# Patient Record
Sex: Male | Born: 1945 | Hispanic: No | Smoking: Never smoker
Health system: Southern US, Community
[De-identification: ages and names within clinical notes are randomized; demographics above are authoritative.]

## PROBLEM LIST (undated history)

## (undated) DIAGNOSIS — N189 Chronic kidney disease, unspecified: Secondary | ICD-10-CM

## (undated) DIAGNOSIS — E875 Hyperkalemia: Secondary | ICD-10-CM

## (undated) DIAGNOSIS — M719 Bursopathy, unspecified: Secondary | ICD-10-CM

## (undated) DIAGNOSIS — I10 Essential (primary) hypertension: Secondary | ICD-10-CM

## (undated) DIAGNOSIS — E119 Type 2 diabetes mellitus without complications: Secondary | ICD-10-CM

## (undated) DIAGNOSIS — D649 Anemia, unspecified: Secondary | ICD-10-CM

## (undated) HISTORY — PX: HERNIA REPAIR: SHX51

## (undated) HISTORY — PX: EYE SURGERY: SHX253

## (undated) HISTORY — PX: KNEE ARTHROSCOPY: SHX127

---

## 2010-07-21 ENCOUNTER — Ambulatory Visit: Payer: Self-pay | Admitting: Unknown Physician Specialty

## 2012-09-11 ENCOUNTER — Ambulatory Visit: Payer: Self-pay | Admitting: Family Medicine

## 2013-07-08 ENCOUNTER — Ambulatory Visit: Payer: Self-pay | Admitting: Surgery

## 2013-07-08 LAB — BASIC METABOLIC PANEL
ANION GAP: 4 — AB (ref 7–16)
BUN: 25 mg/dL — AB (ref 7–18)
CHLORIDE: 108 mmol/L — AB (ref 98–107)
Calcium, Total: 8.9 mg/dL (ref 8.5–10.1)
Co2: 25 mmol/L (ref 21–32)
Creatinine: 1.61 mg/dL — ABNORMAL HIGH (ref 0.60–1.30)
EGFR (African American): 50 — ABNORMAL LOW
GFR CALC NON AF AMER: 43 — AB
Glucose: 149 mg/dL — ABNORMAL HIGH (ref 65–99)
OSMOLALITY: 281 (ref 275–301)
Potassium: 4.5 mmol/L (ref 3.5–5.1)
SODIUM: 137 mmol/L (ref 136–145)

## 2013-07-08 LAB — CBC
HCT: 35.9 % — ABNORMAL LOW (ref 40.0–52.0)
HGB: 12 g/dL — ABNORMAL LOW (ref 13.0–18.0)
MCH: 29.8 pg (ref 26.0–34.0)
MCHC: 33.5 g/dL (ref 32.0–36.0)
MCV: 89 fL (ref 80–100)
Platelet: 183 10*3/uL (ref 150–440)
RBC: 4.04 10*6/uL — ABNORMAL LOW (ref 4.40–5.90)
RDW: 13.6 % (ref 11.5–14.5)
WBC: 11 10*3/uL — AB (ref 3.8–10.6)

## 2013-07-15 ENCOUNTER — Ambulatory Visit: Payer: Self-pay | Admitting: Surgery

## 2014-05-30 NOTE — Op Note (Signed)
PATIENT NAME:  Thomas Rice, Thomas Rice MR#:  [id number] DATE OF BIRTH:  [birth year]  DATE OF PROCEDURE:  07/15/2013  PREOPERATIVE DIAGNOSIS: Recurrent left inguinal hernia.   POSTOPERATIVE DIAGNOSIS: Recurrent left inguinal hernia.   PROCEDURE: Left inguinal hernia repair.   SURGEON: Renda RollsWilton Smith, M.D.   ANESTHESIA: General.   INDICATIONS: This 69 year old male, who had laparoscopic left inguinal hernia repair some 20 years ago came in with a history of bulging dating back to January. He did have physical findings of a left inguinal hernia with approximately a 5 cm bulge in the left groin, which was reducible. Repair was recommended for definitive treatment.   DESCRIPTION OF PROCEDURE: The patient was placed on the operating table in the supine position under general anesthesia. The left lower quadrant of the abdomen was clipped and prepared with ChloraPrep and draped in a sterile manner.   A left lower quadrant transversely oriented suprapubic incision was made and carried down through subcutaneous tissues. Several small bleeding points were cauterized. Scarpa'Rice fascia was incised. The external oblique aponeurosis was incised along the course of its fibers to open the external ring and expose the inguinal cord structures. These cord structures were mobilized. A Penrose drain was passed around the cord structures. There was a finding of a direct inguinal hernia with approximately 2.5 cm area of weakness of the floor of the inguinal canal. Cremaster fibers were spread to expose the cord structures, which there was no indirect component.   Next, a circumferential incision was made in the attenuated transversalis fascia, which was resected. Next, the hernia sac was inverted. The repair was carried out with a row of 0 Surgilon sutures beginning at the pubic tubercle, suturing the conjoined tendon to the Cooper'Rice ligament with transition stitch onto the shelving edge of the inguinal ligament. The last stitch  led to satisfactory narrowing of the internal ring. Next, an onlay Bard soft mesh was cut to create an oval shape of some 2.8 x 4.5 cm with a notch cut out to straddle the cord structures. The mesh was sutured to the fascia and to the repair with interrupted 0 Surgilon sutures. Hemostasis was intact. Cord structures were replaced along the floor of the inguinal canal. The cut edges of the external oblique aponeurosis were approximated with a running 4-0 Vicryl suture to recreate the external ring. The deep fascia superior and lateral to the repair site was infiltrated with 0.5% Sensorcaine with epinephrine. The subcutaneous tissues were infiltrated as well using a total of 20 mL. Next, the Scarpa'Rice fascia was closed with interrupted 4-0 Vicryl. The skin was closed with running 4-0 Monocryl subcuticular suture and Dermabond. The testicle remained in the scrotum. The patient tolerated surgery satisfactorily and was then prepared for transfer to the recovery room.   ____________________________ Shela CommonsJ. Renda RollsWilton Smith, MD jws:aw D: 07/15/2013 08:53:47 ET T: 07/15/2013 09:10:24 ET JOB#: [id number]  cc: Adella HareJ. Wilton Smith, MD, <Dictator> Adella HareWILTON J SMITH MD ELECTRONICALLY SIGNED 07/15/2013 17:26

## 2014-10-13 IMAGING — US US EXTREM LOW VENOUS*R*
1 series · 14 of 23 positions shown · non-contrast
Comparison: none

REASON FOR EXAM: edema  fell 9 days ago  eval for DVT
COMMENTS:

PROCEDURE:     US  - US DOPPLER LOW EXTR RIGHT  - September 11, 2012  [DATE]
RESULT:     Comparison: None

[Series 1: us extrem low venous*right* · 0.10mm/px · 14 of 23 slices shown]
[im 1/23]
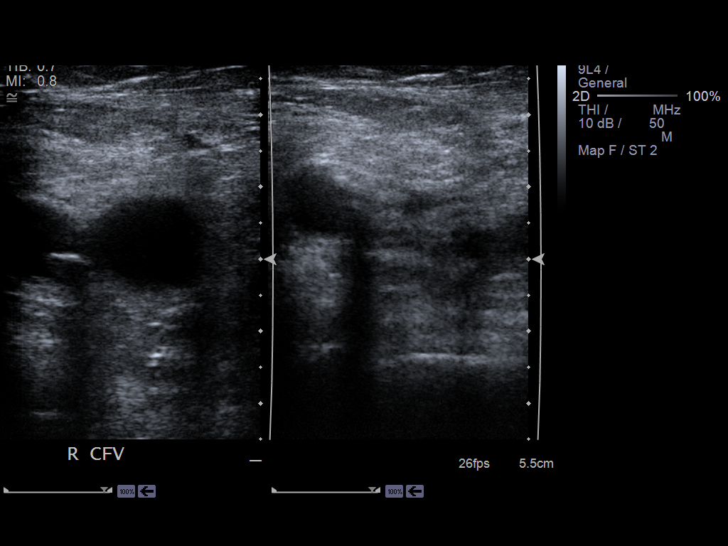
[im 3/23]
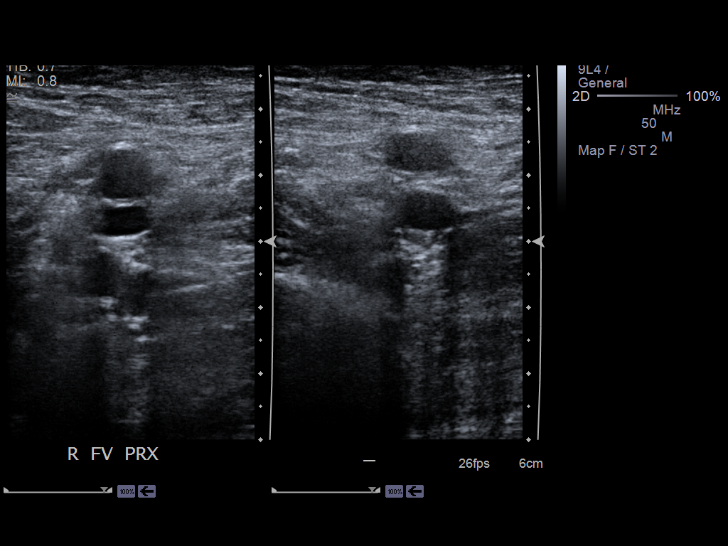
[im 5/23]
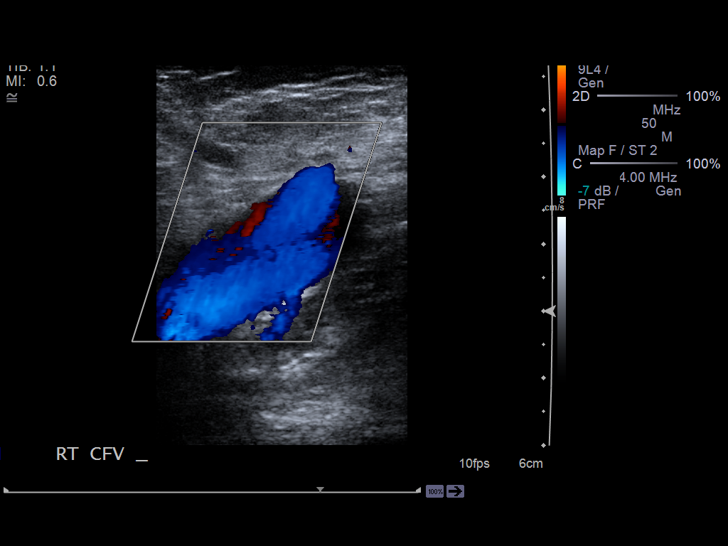
[im 6/23]
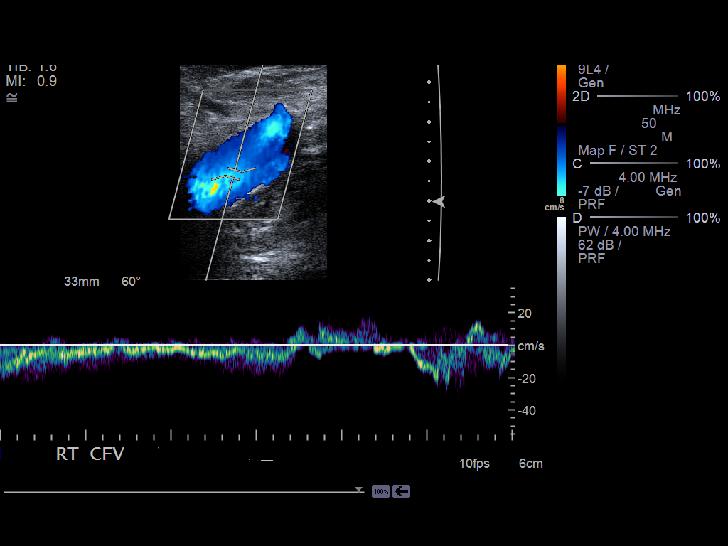
[im 8/23]
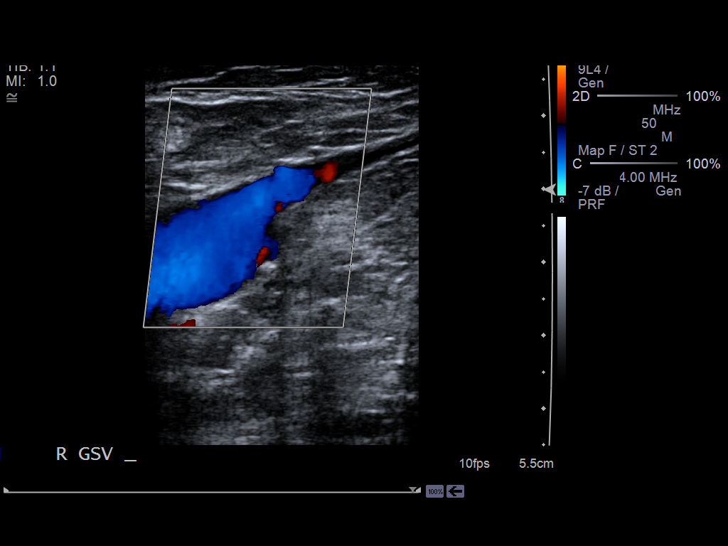
[im 10/23]
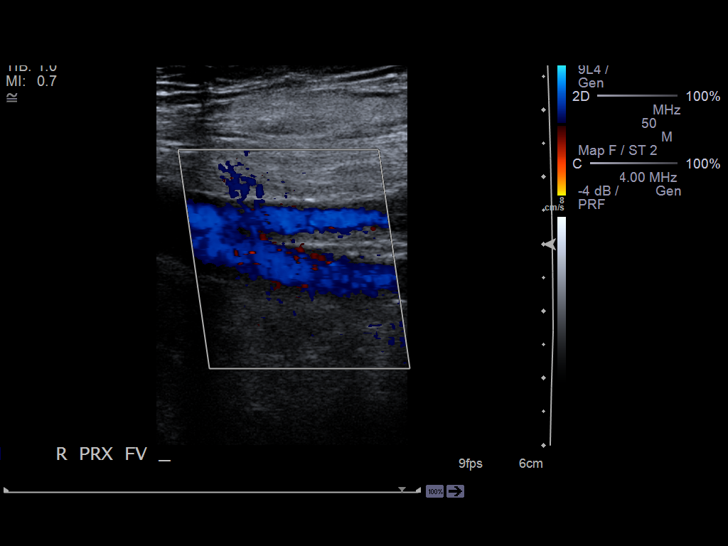
[im 11/23]
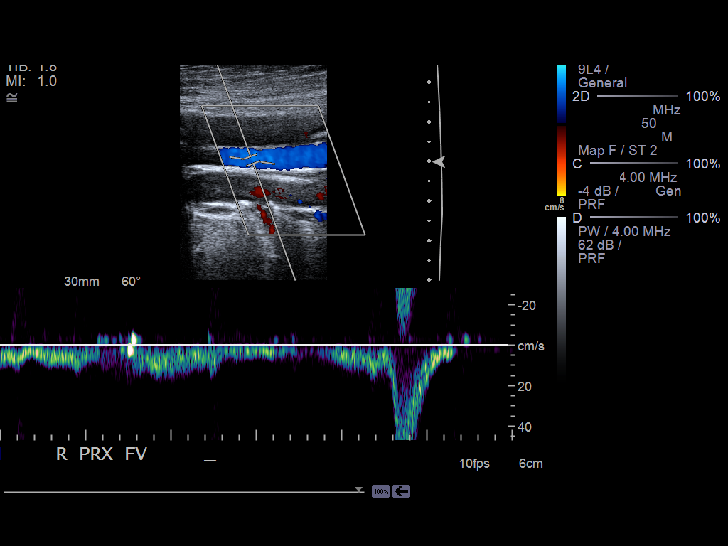
[im 13/23]
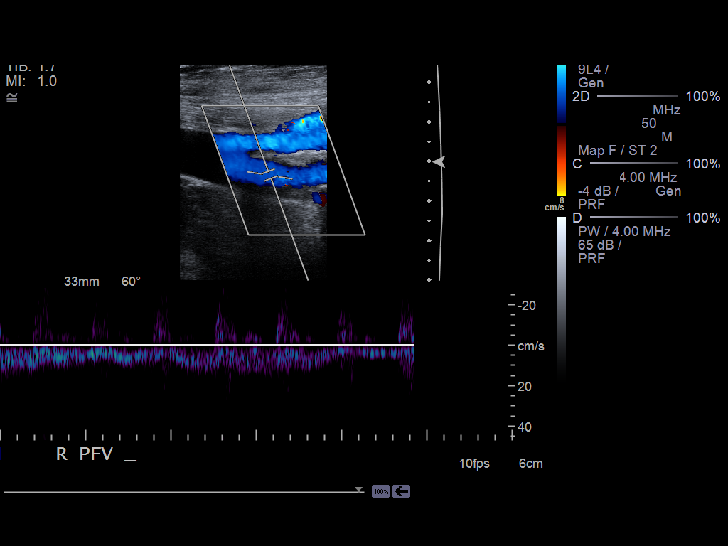
[im 14/23]
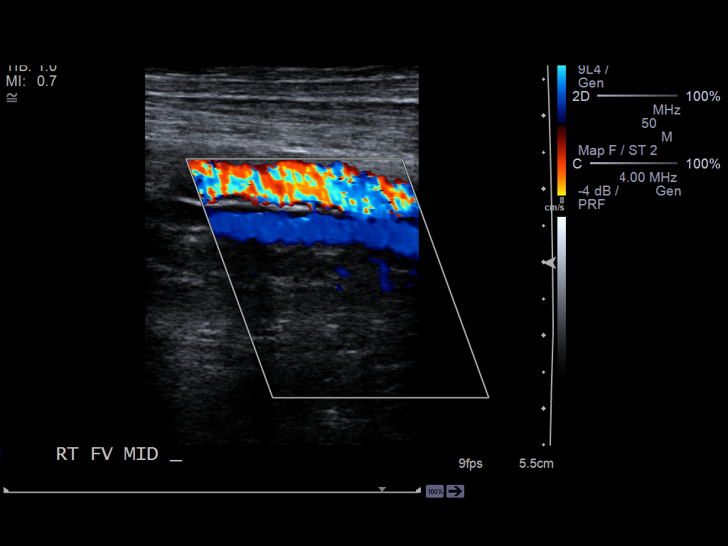
[im 16/23]
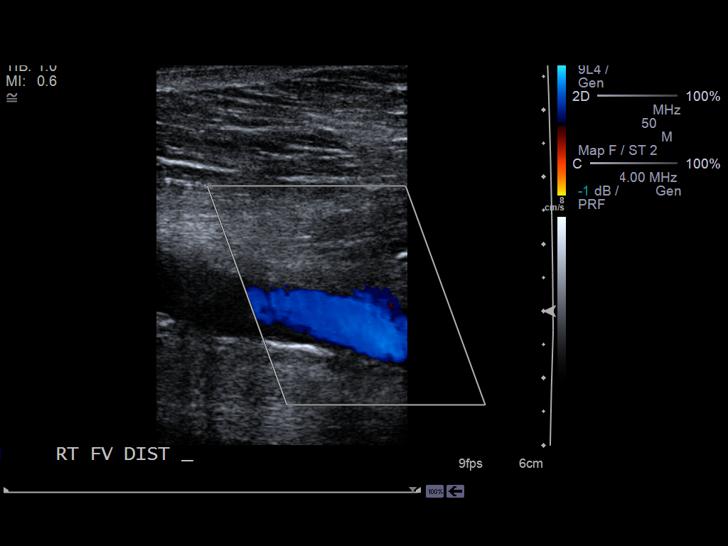
[im 18/23]
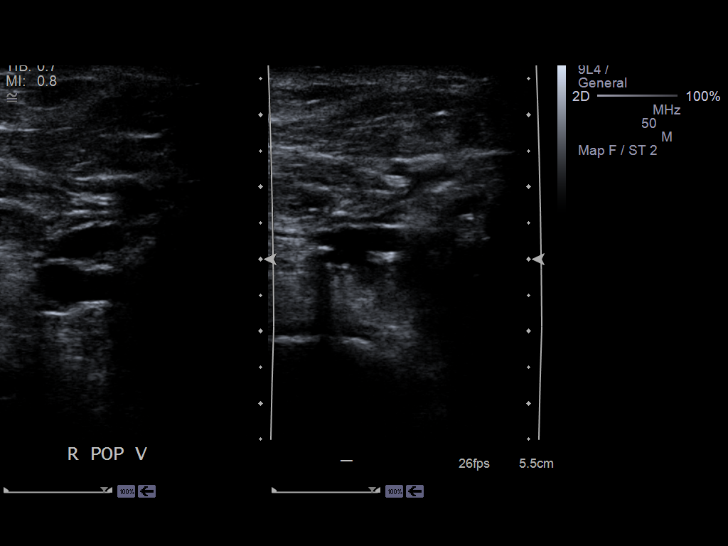
[im 19/23]
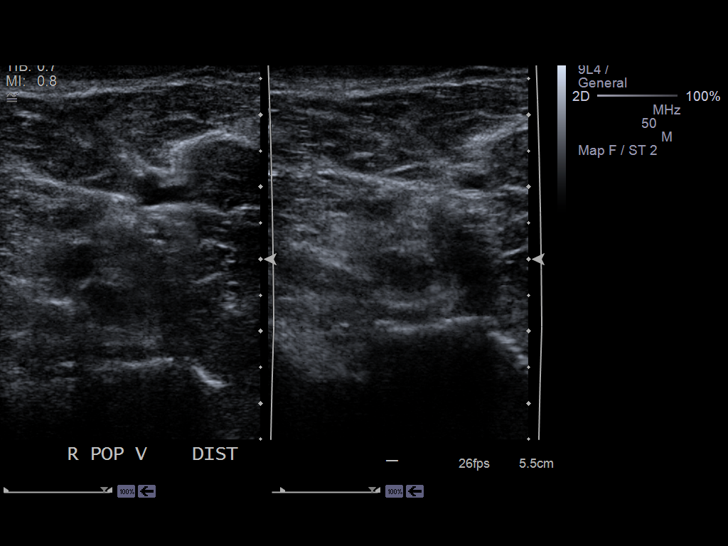
[im 21/23]
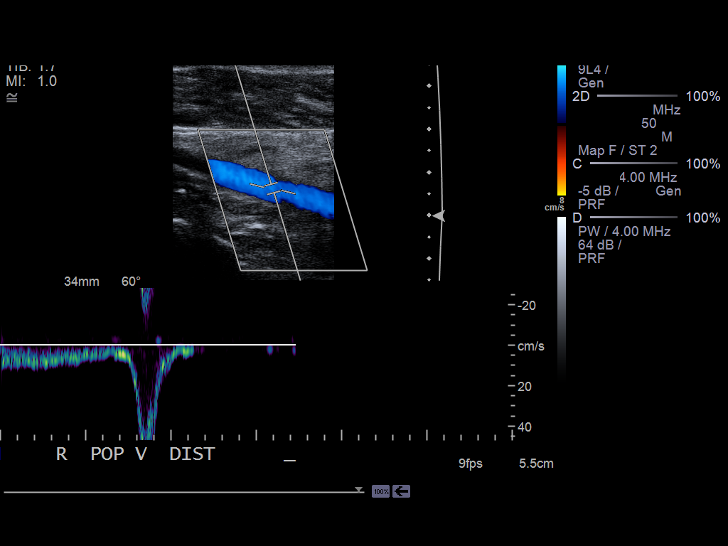
[im 23/23]
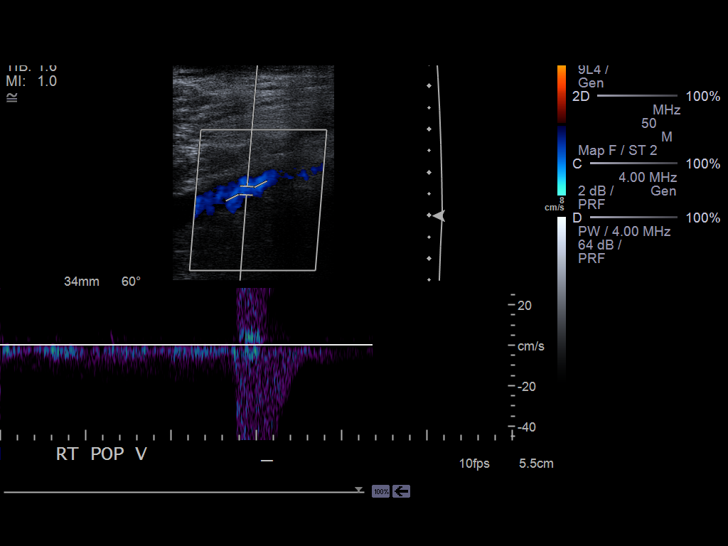

[14 of 23 positions shown; findings below may reference images not displayed]

FINDINGS: Multiple longitudinal and transverse gray-scale as well as color
and spectral Doppler images of the right lower extremity veins were obtained
from the common femoral veins through the popliteal veins.

The right common femoral, greater saphenous, femoral, popliteal veins, and
venous trifurcation are patent, demonstrating normal color-flow and
compressibility. No intraluminal thrombus is identified.There is normal
respiratory variation and augmentation demonstrated at all vein levels.
IMPRESSION: No evidence of DVT in the right lower extremity.

[REDACTED]

## 2018-12-06 ENCOUNTER — Other Ambulatory Visit: Payer: Self-pay | Admitting: Gastroenterology

## 2018-12-06 DIAGNOSIS — R748 Abnormal levels of other serum enzymes: Secondary | ICD-10-CM

## 2018-12-12 ENCOUNTER — Other Ambulatory Visit: Payer: Self-pay

## 2018-12-12 ENCOUNTER — Ambulatory Visit
Admission: RE | Admit: 2018-12-12 | Discharge: 2018-12-12 | Disposition: A | Discharge status: Home / Self Care | Payer: Medicare HMO | Source: Ambulatory Visit | Attending: Gastroenterology | Admitting: Gastroenterology

## 2018-12-12 DIAGNOSIS — R748 Abnormal levels of other serum enzymes: Secondary | ICD-10-CM | POA: Diagnosis present

## 2019-06-27 ENCOUNTER — Ambulatory Visit: Payer: Self-pay | Admitting: General Surgery

## 2019-06-27 NOTE — H&P (Signed)
PATIENT PROFILE: Thomas Rice is a 74 y.o. male who presents to the Clinic for consultation at the request of Dr. Lennox Grumbles for evaluation of left inguinal hernia.  PCP:  Salley Scarlet, MD  HISTORY OF PRESENT ILLNESS: Thomas Rice reports he had a left inguinal hernia repair on 2016.  He reports that he was doing well until few months ago when he started feeling a lump in the left groin.  Reports some discomfort.  No pain radiation.  No alleviating or aggravating factor.  He usually laid down to reducible large and is not painful.  Denies abdominal distention nausea or vomiting.  He reported he had laparoscopic right inguinal hernia repair in the 1990s and then open repair of the left groin on 2016.   PROBLEM LIST:        Problem List  Date Reviewed: 07/02/2013       Noted   Diabetes mellitus type 2, uncomplicated (CMS-HCC) Unknown   Hypertension Unknown      GENERAL REVIEW OF SYSTEMS:   General ROS: negative for - chills, fatigue, fever, weight gain or weight loss Allergy and Immunology ROS: negative for - hives  Hematological and Lymphatic ROS: negative for - bleeding problems or bruising, negative for palpable nodes Endocrine ROS: negative for - heat or cold intolerance, hair changes Respiratory ROS: negative for - cough, shortness of breath or wheezing Cardiovascular ROS: no chest pain or palpitations GI ROS: negative for nausea, vomiting, abdominal pain, diarrhea, constipation Musculoskeletal ROS: negative for - joint swelling or muscle pain Neurological ROS: negative for - confusion, syncope Dermatological ROS: negative for pruritus and rash Psychiatric: negative for anxiety, depression, difficulty sleeping and memory loss  MEDICATIONS: Current Medications        Current Outpatient Medications  Medication Sig Dispense Refill  . ACCU-CHEK AVIVA PLUS TEST STRP test strip     . ACCU-CHEK SOFTCLIX LANCETS lancets     . aspirin 81 MG EC tablet     .  enalapril (VASOTEC) 20 MG tablet Take 20 mg by mouth once daily.    . ergocalciferol, vitamin D2, (VITAMIN D2 ORAL) Take 1,250 mcg by mouth monthly       . glipiZIDE (GLUCOTROL XL) 5 MG XL tablet Take 10 mg by mouth once daily.     Marland Kitchen linaGLIPtin (TRADJENTA) 5 mg tablet Take 1 tablet by mouth once daily     No current facility-administered medications for this visit.      ALLERGIES: Patient has no known allergies.  PAST MEDICAL HISTORY:     Past Medical History:  Diagnosis Date  . Diabetes mellitus type 2, uncomplicated (CMS-HCC)   . Hypertension     PAST SURGICAL HISTORY:      Past Surgical History:  Procedure Laterality Date  . INGUINAL HERNIA REPAIR Right 1990s  . KNEE ARTHROSCOPY  1980  . REPAIR INGUINAL HERNIA Left 2015   Dr. Tamala Julian     FAMILY HISTORY:      Family History  Problem Relation Age of Onset  . High blood pressure (Hypertension) Father   . Diabetes Maternal Grandfather   . High blood pressure (Hypertension) Maternal Grandfather      SOCIAL HISTORY: Social History          Socioeconomic History  . Marital status: Married    Spouse name: Not on file  . Number of children: Not on file  . Years of education: Not on file  . Highest education level: Not on file  Occupational History  .  Not on file  Tobacco Use  . Smoking status: Never Smoker  . Smokeless tobacco: Never Used  Substance and Sexual Activity  . Alcohol use: No  . Drug use: No  . Sexual activity: Not on file  Other Topics Concern  . Not on file  Social History Narrative  . Not on file   Social Determinants of Health      Financial Resource Strain:   . Difficulty of Paying Living Expenses:   Food Insecurity:   . Worried About Charity fundraiser in the Last Year:   . Arboriculturist in the Last Year:   Transportation Needs:   . Film/video editor (Medical):   Marland Kitchen Lack of Transportation (Non-Medical):       PHYSICAL EXAM:    Vitals:    06/12/19 1104  BP: 176/85  Pulse: 80   Body mass index is 25.7 kg/m. Weight: 76.7 kg (169 lb)   GENERAL: Alert, active, oriented x3  HEENT: Pupils equal reactive to light. Extraocular movements are intact. Sclera clear. Palpebral conjunctiva normal red color.Pharynx clear.  NECK: Supple with no palpable mass and no adenopathy.  LUNGS: Sound clear with no rales rhonchi or wheezes.  HEART: Regular rhythm S1 and S2 without murmur.  ABDOMEN: Soft and depressible, nontender with no palpable mass, no hepatomegaly.  Soft reducible left inguinal hernia.  EXTREMITIES: Well-developed well-nourished symmetrical with no dependent edema.  NEUROLOGICAL: Awake alert oriented, facial expression symmetrical, moving all extremities.  REVIEW OF DATA: I have reviewed the following data today: No visits with results within 3 Month(s) from this visit.  Latest known visit with results is:  Initial consult on 12/06/2018  Component Date Value  . WBC (White Blood Cell Co* 12/06/2018 7.2   . RBC (Red Blood Cell Coun* 12/06/2018 4.23*  . Hemoglobin 12/06/2018 12.7*  . Hematocrit 12/06/2018 39.4*  . MCV (Mean Corpuscular Vo* 12/06/2018 93.1   . MCH (Mean Corpuscular He* 12/06/2018 30.0   . MCHC (Mean Corpuscular H* 12/06/2018 32.2   . Platelet Count 12/06/2018 221   . RDW-CV (Red Cell Distrib* 12/06/2018 12.8   . MPV (Mean Platelet Volum* 12/06/2018 10.8   . Neutrophils 12/06/2018 5.23   . Lymphocytes 12/06/2018 1.18   . Monocytes 12/06/2018 0.49   . Eosinophils 12/06/2018 0.18   . Basophils 12/06/2018 0.07   . Neutrophil % 12/06/2018 72.9*  . Lymphocyte % 12/06/2018 16.5   . Monocyte % 12/06/2018 6.8   . Eosinophil % 12/06/2018 2.5   . Basophil% 12/06/2018 1.0   . Immature Granulocyte % 12/06/2018 0.3   . Immature Granulocyte Cou* 12/06/2018 0.02   . Ferritin 12/06/2018 131   . Iron 12/06/2018 124   . Total Iron Binding Capac* 12/06/2018 380.1   . Transferrin 12/06/2018 271.5    . % Saturation 12/06/2018 33   . Vitamin D, 25-Hydroxy - * 12/06/2018 31.3   . Vitamin B12 12/06/2018 199*  . Folate (Folic Acid) 02/54/2706 17.6   . Protein, Total 12/06/2018 7.3   . Albumin 12/06/2018 4.7   . Bilirubin, Total 12/06/2018 0.5   . Bilirubin, Conjugated 12/06/2018 0.10   . Alk Phos (alkaline Phosp* 12/06/2018 134*  . AST  12/06/2018 16   . ALT  12/06/2018 14   . Antinuclear Antibodies, * 12/06/2018 Negative   . Smooth Muscle Ab - [facility]* 12/06/2018 8   . Mitochondrial Ab - [facility]* 12/06/2018 <20.0   . Total IgG - [facility] 12/06/2018 1125   . Immunoglobulin A,  Qn, Se* 12/06/2018 287   . IgM - [facility] 12/06/2018 53   . Immunoglobulin E, Total * 12/06/2018 70   . Ceruloplasmin - [facility] 12/06/2018 15.7*  . A-1 Antitrypsin - [facility] 12/06/2018 134   . A-1 Antitrypsin Phenotyp* 12/06/2018 MM   . Prothrombin Time 12/06/2018 11.6   . Prothrombin INR 12/06/2018 1.0*  . Hepatitis B Surface Anti* 12/06/2018 Negative   . Hepatitis Be Antigen - L* 12/06/2018 Negative   . Hepatitis B Core IgM Ant* 12/06/2018 Negative   . Hep B Core Total Ab - La* 12/06/2018 Negative   . Hep B E Ab - [facility] 12/06/2018 Negative   . Hep B Surface Ab, Qual -* 12/06/2018 Non Reactive   . Hep A IgM - [facility] 12/06/2018 Negative   . Gamma Glutamyl Transfera* 12/06/2018 15   . Alkaline Phosphatase - L* 12/06/2018 149*  . Liver Fraction: - [facility] 12/06/2018 64   . Bone Fraction: - [facility] 12/06/2018 36   . Intestinal Frac.: - [facility]* 12/06/2018 0      ASSESSMENT: Thomas Rice is a 74 y.o. male presenting for consultation for left recurrent inguinal hernia.    The patient presents with a symptomatic, reducible recurrent left inguinal hernia. Patient was oriented about the diagnosis of inguinal hernia and its implication. The patient was oriented about the treatment alternatives (observation vs surgical repair). Due to patient symptoms, repair is recommended. Patient oriented about the surgical  procedure, the use of mesh and its risk of complications such as: infection, bleeding, injury to vas deference, vasculature and testicle, injury to bowel or bladder, and chronic pain.  Unilateral recurrent inguinal hernia without obstruction or gangrene [K40.91]  PLAN: 1. Robotic assisted laparoscopic left recurrent inguinal hernia repair with mesh ([id number]) 2.  CBC, CMP 3.  Avoid taking aspirin 5 days before procedure 4.  Contact us if has any question or concern.  Patient verbalized understanding, all questions were answered, and were agreeable with the plan outlined above.    Sharee Sturdy Cintron-Diaz, MD  

## 2019-06-27 NOTE — H&P (View-Only) (Signed)
PATIENT PROFILE: Thomas Rice is a 74 y.o. male who presents to the Clinic for consultation at the request of Dr. Lennox Rice for evaluation of left inguinal hernia.  PCP:  Thomas Scarlet, MD  HISTORY OF PRESENT ILLNESS: Thomas Rice reports he had a left inguinal hernia repair on 2016.  He reports that he was doing well until few months ago when he started feeling a lump in the left groin.  Reports some discomfort.  No pain radiation.  No alleviating or aggravating factor.  He usually laid down to reducible large and is not painful.  Denies abdominal distention nausea or vomiting.  He reported he had laparoscopic right inguinal hernia repair in the 1990s and then open repair of the left groin on 2016.   PROBLEM LIST:        Problem List  Date Reviewed: 07/02/2013       Noted   Diabetes mellitus type 2, uncomplicated (CMS-HCC) Unknown   Hypertension Unknown      GENERAL REVIEW OF SYSTEMS:   General ROS: negative for - chills, fatigue, fever, weight gain or weight loss Allergy and Immunology ROS: negative for - hives  Hematological and Lymphatic ROS: negative for - bleeding problems or bruising, negative for palpable nodes Endocrine ROS: negative for - heat or cold intolerance, hair changes Respiratory ROS: negative for - cough, shortness of breath or wheezing Cardiovascular ROS: no chest pain or palpitations GI ROS: negative for nausea, vomiting, abdominal pain, diarrhea, constipation Musculoskeletal ROS: negative for - joint swelling or muscle pain Neurological ROS: negative for - confusion, syncope Dermatological ROS: negative for pruritus and rash Psychiatric: negative for anxiety, depression, difficulty sleeping and memory loss  MEDICATIONS: Current Medications        Current Outpatient Medications  Medication Sig Dispense Refill  . ACCU-CHEK AVIVA PLUS TEST STRP test strip     . ACCU-CHEK SOFTCLIX LANCETS lancets     . aspirin 81 MG EC tablet     .  enalapril (VASOTEC) 20 MG tablet Take 20 mg by mouth once daily.    . ergocalciferol, vitamin D2, (VITAMIN D2 ORAL) Take 1,250 mcg by mouth monthly       . glipiZIDE (GLUCOTROL XL) 5 MG XL tablet Take 10 mg by mouth once daily.     Thomas Rice Kitchen linaGLIPtin (TRADJENTA) 5 mg tablet Take 1 tablet by mouth once daily     No current facility-administered medications for this visit.      ALLERGIES: Patient has no known allergies.  PAST Thomas Rice HISTORY:     Past Thomas Rice History:  Diagnosis Date  . Diabetes mellitus type 2, uncomplicated (CMS-HCC)   . Hypertension     PAST SURGICAL HISTORY:      Past Surgical History:  Procedure Laterality Date  . INGUINAL HERNIA REPAIR Right 1990s  . KNEE ARTHROSCOPY  1980  . REPAIR INGUINAL HERNIA Left 2015   Thomas Rice     FAMILY HISTORY:      Family History  Problem Relation Age of Onset  . High blood pressure (Hypertension) Father   . Diabetes Maternal Grandfather   . High blood pressure (Hypertension) Maternal Grandfather      SOCIAL HISTORY: Social History          Socioeconomic History  . Marital status: Married    Spouse name: Not on file  . Number of children: Not on file  . Years of education: Not on file  . Highest education level: Not on file  Occupational History  .  Not on file  Tobacco Use  . Smoking status: Never Smoker  . Smokeless tobacco: Never Used  Substance and Sexual Activity  . Alcohol use: No  . Drug use: No  . Sexual activity: Not on file  Other Topics Concern  . Not on file  Social History Narrative  . Not on file   Social Determinants of Health      Financial Resource Strain:   . Difficulty of Paying Living Expenses:   Food Insecurity:   . Worried About Charity fundraiser in the Last Year:   . Arboriculturist in the Last Year:   Transportation Needs:   . Film/video editor (Thomas Rice):   Thomas Rice Kitchen Lack of Transportation (Non-Thomas Rice):       PHYSICAL EXAM:    Vitals:    06/12/19 1104  BP: 176/85  Pulse: 80   Body mass index is 25.7 kg/m. Weight: 76.7 kg (169 lb)   GENERAL: Alert, active, oriented x3  HEENT: Pupils equal reactive to light. Extraocular movements are intact. Sclera clear. Palpebral conjunctiva normal red color.Pharynx clear.  NECK: Supple with no palpable mass and no adenopathy.  LUNGS: Sound clear with no rales rhonchi or wheezes.  HEART: Regular rhythm S1 and S2 without murmur.  ABDOMEN: Soft and depressible, nontender with no palpable mass, no hepatomegaly.  Soft reducible left inguinal hernia.  EXTREMITIES: Well-developed well-nourished symmetrical with no dependent edema.  NEUROLOGICAL: Awake alert oriented, facial expression symmetrical, moving all extremities.  REVIEW OF DATA: I have reviewed the following data today: No visits with results within 3 Month(s) from this visit.  Latest known visit with results is:  Initial consult on 12/06/2018  Component Date Value  . WBC (White Blood Cell Co* 12/06/2018 7.2   . RBC (Red Blood Cell Coun* 12/06/2018 4.23*  . Hemoglobin 12/06/2018 12.7*  . Hematocrit 12/06/2018 39.4*  . MCV (Mean Corpuscular Vo* 12/06/2018 93.1   . MCH (Mean Corpuscular He* 12/06/2018 30.0   . MCHC (Mean Corpuscular H* 12/06/2018 32.2   . Platelet Count 12/06/2018 221   . RDW-CV (Red Cell Distrib* 12/06/2018 12.8   . MPV (Mean Platelet Volum* 12/06/2018 10.8   . Neutrophils 12/06/2018 5.23   . Lymphocytes 12/06/2018 1.18   . Monocytes 12/06/2018 0.49   . Eosinophils 12/06/2018 0.18   . Basophils 12/06/2018 0.07   . Neutrophil % 12/06/2018 72.9*  . Lymphocyte % 12/06/2018 16.5   . Monocyte % 12/06/2018 6.8   . Eosinophil % 12/06/2018 2.5   . Basophil% 12/06/2018 1.0   . Immature Granulocyte % 12/06/2018 0.3   . Immature Granulocyte Cou* 12/06/2018 0.02   . Ferritin 12/06/2018 131   . Iron 12/06/2018 124   . Total Iron Binding Capac* 12/06/2018 380.1   . Transferrin 12/06/2018 271.5    . % Saturation 12/06/2018 33   . Vitamin D, 25-Hydroxy - * 12/06/2018 31.3   . Vitamin B12 12/06/2018 199*  . Folate (Folic Acid) 02/54/2706 17.6   . Protein, Total 12/06/2018 7.3   . Albumin 12/06/2018 4.7   . Bilirubin, Total 12/06/2018 0.5   . Bilirubin, Conjugated 12/06/2018 0.10   . Alk Phos (alkaline Phosp* 12/06/2018 134*  . AST  12/06/2018 16   . ALT  12/06/2018 14   . Antinuclear Antibodies, * 12/06/2018 Negative   . Smooth Muscle Ab - [facility]* 12/06/2018 8   . Mitochondrial Ab - [facility]* 12/06/2018 <20.0   . Total IgG - [facility] 12/06/2018 1125   . Immunoglobulin A,  Qn, Se* 12/06/2018 287   . IgM - [facility] 12/06/2018 53   . Immunoglobulin E, Total * 12/06/2018 70   . Ceruloplasmin - [facility] 12/06/2018 15.7*  . A-1 Antitrypsin - [facility] 12/06/2018 134   . A-1 Antitrypsin Phenotyp* 12/06/2018 MM   . Prothrombin Time 12/06/2018 11.6   . Prothrombin INR 12/06/2018 1.0*  . Hepatitis B Surface Anti* 12/06/2018 Negative   . Hepatitis Be Antigen - L* 12/06/2018 Negative   . Hepatitis B Core IgM Ant* 12/06/2018 Negative   . Hep B Core Total Ab - [facility]* 12/06/2018 Negative   . Hep B E Ab - [facility] 12/06/2018 Negative   . Hep B Surface Ab, Qual -* 12/06/2018 Non Reactive   . Hep A IgM - [facility] 12/06/2018 Negative   . Gamma Glutamyl Transfera* 12/06/2018 15   . Alkaline Phosphatase - L* 12/06/2018 149*  . Liver Fraction: - [facility] 12/06/2018 64   . Bone Fraction: - [facility] 12/06/2018 36   . Intestinal Frac.: - [facility]* 12/06/2018 0      ASSESSMENT: Mr. Duffey is a 74 y.o. male presenting for consultation for left recurrent inguinal hernia.    The patient presents with a symptomatic, reducible recurrent left inguinal hernia. Patient was oriented about the diagnosis of inguinal hernia and its implication. The patient was oriented about the treatment alternatives (observation vs surgical repair). Due to patient symptoms, repair is recommended. Patient oriented about the surgical  procedure, the use of mesh and its risk of complications such as: infection, bleeding, injury to vas deference, vasculature and testicle, injury to bowel or bladder, and chronic pain.  Unilateral recurrent inguinal hernia without obstruction or gangrene [K40.91]  PLAN: 1. Robotic assisted laparoscopic left recurrent inguinal hernia repair with mesh ([id number]) 2.  CBC, CMP 3.  Avoid taking aspirin 5 days before procedure 4.  Contact us if has any question or concern.  Patient verbalized understanding, all questions were answered, and were agreeable with the plan outlined above.    Herbert Pun, MD

## 2019-07-11 ENCOUNTER — Encounter
Admission: RE | Admit: 2019-07-11 | Discharge: 2019-07-11 | Disposition: A | Discharge status: Home / Self Care | Payer: Medicare HMO | Source: Ambulatory Visit | Attending: General Surgery | Admitting: General Surgery

## 2019-07-11 ENCOUNTER — Other Ambulatory Visit: Payer: Self-pay

## 2019-07-11 DIAGNOSIS — Z20822 Contact with and (suspected) exposure to covid-19: Secondary | ICD-10-CM | POA: Diagnosis not present

## 2019-07-11 DIAGNOSIS — E118 Type 2 diabetes mellitus with unspecified complications: Secondary | ICD-10-CM | POA: Insufficient documentation

## 2019-07-11 DIAGNOSIS — Z01818 Encounter for other preprocedural examination: Secondary | ICD-10-CM | POA: Insufficient documentation

## 2019-07-11 DIAGNOSIS — I10 Essential (primary) hypertension: Secondary | ICD-10-CM | POA: Insufficient documentation

## 2019-07-11 HISTORY — DX: Essential (primary) hypertension: I10

## 2019-07-11 HISTORY — DX: Type 2 diabetes mellitus without complications: E11.9

## 2019-07-11 HISTORY — DX: Chronic kidney disease, unspecified: N18.9

## 2019-07-11 HISTORY — DX: Hyperkalemia: E87.5

## 2019-07-11 HISTORY — DX: Bursopathy, unspecified: M71.9

## 2019-07-11 HISTORY — DX: Anemia, unspecified: D64.9

## 2019-07-11 NOTE — Pre-Procedure Instructions (Signed)
Progress Notes - documented in this encounter Thomas Dakin, MD - 06/10/2019 9:00 AM EDT Formatting of this note is different from the original. Images from the original note were not included. DIVISION OF NEPHROLOGY AND HYPERTENSION [employer] [address] [place], [place] [address]  Date of Service: 06/10/2019   PCP: Referring Provider:  Marguerita Merles, MD [address] [place] [address] Phone: [phone] Fax: [place], [place] Thomas Rice [address] [place], [place] [address] Phone: [phone] Fax: [phone]   06/10/2019  Background: Thomas Rice has been following with [place] since 2017 when he was evaluated for elevated Cr. He has a longstanding h/o HTN (since ~1980) and DM2 since his 50-60s. Cr had increased from previous BL of ~1.5 to 2 at time of referral. He did not have any findings on urine sediment and had only 28 mg/gm on UACR. Cause of CKD is believed to be 2/2 DM and HTN. DM2 has been well-controlled, and BP is usually pretty well controlled.  Chief Complaint: fu CKD, HTN and DM2  HPI: Thomas. Thomas Rice presents for routine 6-mo fu. I last saw him 01/06/2019. Today, he reports that he never started SGLT2i. He actually found that he was making this "loaf cake" that was leading to elevated BS. So he has decided to cut this out cold [place], and thinks his BS are improving. He notes that when he started eating like this, it was in part due to sadness and depression after his wife's death. But he feels that he is now listening to the voice in his head, which says to keep going.  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 He gets a lot of exercise between these two jobs. Staying busy helps him to avoid getting too sad.  He notes that his hernia hs  returned, and he has been referred to see GI at [facility] for it. It bulges and bothers him sometimes.  ROS:  CONSTITUTIONAL: denies fevers or chills, denies unintentional weight loss CARDIOVASCULAR: denies chest pain, denies dyspnea on exertion, denies leg edema GASTROINTESTINAL: denies nausea, denies vomiting, denies anorexia GENITOURINARY: denies dysuria, denies hematuria, denies foamy urine, denies decreased urinary stream All other systems reviewed and are negative except as listed above.  PAST MEDICAL HISTORY: Past Medical History:  Diagnosis Date  . Chronic kidney disease  . Hypertension   PAST SURGICAL HISTORY: Past Surgical History:  Procedure Laterality Date  . CATARACT EXTRACTION, BILATERAL Bilateral 2017  . INGUINAL HERNIA REPAIR  20 yrs apart  . KNEE SURGERY 1981  arthroscopic after injury   SOCIAL HISTORY: Social History   Socioeconomic History  . Marital status: Not on file  Spouse name: Not on file  . Number of children: Not on file  . Years of education: Not on file  . Highest education level: Not on file  Occupational History  . Not on file  Tobacco Use  . Smoking status: Never Smoker  . Smokeless tobacco: Never Used  Substance and Sexual Activity  . Alcohol use: No  Alcohol/week: 0.0 standard drinks  . Drug use: No  . Sexual activity: Not on file  Other Topics Concern  . Not on file  Social History Narrative  . Not on file   Social Determinants of Health   Financial  Resource Strain:  . Difficulty of Paying Living Expenses:  Food Insecurity:  . Worried About Charity fundraiser in the Last Year:  . Arboriculturist in the Last Year:  Transportation Needs:  . Film/video editor (Medical):  Marland Kitchen Lack of Transportation (Non-Medical):  Physical Activity:  . Days of Exercise per Week:  . Minutes of Exercise per Session:  Stress:  . Feeling of Stress :  Social Connections:  . Frequency of Communication with Friends and Family:  .  Frequency of Social Gatherings with Friends and Family:  . Attends Religious Services:  . Active Member of Clubs or Organizations:  . Attends Archivist Meetings:  Marland Kitchen Marital Status:   FAMILY HISTORY: Unchanged from previous visit. Family History  Problem Relation Age of Onset  . Alcohol abuse Mother  . Uterine cancer Mother  . Heart failure Mother  . Pneumonia Father  . Hypertension Father   ALLERGIES: Patient has no known allergies.  MEDICATIONS: Current Outpatient Medications  Medication Sig Dispense Refill  . aspirin (ECOTRIN) 81 MG tablet Take 81 mg by mouth daily.  . enalapril (VASOTEC) 20 MG tablet Take 20 mg by mouth daily.  Marland Kitchen glipiZIDE (GLUCOTROL XL) 5 MG 24 hr tablet Take 10 mg by mouth. Take 2 tablets every day  . latanoprost (XALATAN) 0.005 % ophthalmic solution  . linagliptin (TRADJENTA) 5 mg Tab Take 5 mg by mouth daily.  . empagliflozin (JARDIANCE) 10 mg tablet Take 1 tablet (10 mg total) by mouth daily. (Patient not taking: Reported on 06/10/2019) 30 tablet 11  . ergocalciferol (DRISDOL) 1,250 mcg (50,000 unit) capsule (Patient not taking: Reported on 06/10/2019)  . metFORMIN (GLUCOPHAGE-XR) 500 MG 24 hr tablet Take 1,000 mg by mouth. (Patient not taking: Reported on 06/10/2019)   No current facility-administered medications for this visit.   PHYSICAL EXAM: Vitals:  06/10/19 0900  BP: 144/72  Pulse: 62  Temp: 36.6 C (97.8 F)   Body mass index is 24.87 kg/m. CONSTITUTIONAL: Alert,well appearing, no distress HEENT: Moist mucous membranes, oropharynx clear without erythema or exudate NECK: Supple, no lymphadenopathy CARDIOVASCULAR: Regular, normal S1/S2 heart sounds, no murmurs, no rubs.  PULM: Clear to auscultation bilaterally GASTROINTESTINAL: Soft, active bowel sounds, nontender MSK/EXTREMITIES: No lower extremity edema bilaterally, dorsalis pedis pulses 2+ bilaterally  SKIN: No rashes or lesions NEUROLOGIC: No focal motor or sensory  deficits  BP Readings from Last 5 Encounters:  06/10/19 144/72  01/06/19 160/85  04/23/17 110/70  12/23/15 122/59  08/19/15 139/66   MEDICAL DECISION MAKING  Results for orders placed or performed in visit on 05/30/19  Hemoglobin A1c  Result Value Ref Range  Hemoglobin A1C 6.9 (H) 4.8 - 5.6 %  Estimated Average Glucose 151 mg/dL  Albumin/creatinine urine ratio  Result Value Ref Range  Creat U 84.1 Undefined mg/dL  Albumin Quantitative, Urine 3.7 mg/dL  Albumin/Creatinine Ratio 44.0 (H) 0.0 - 30.0 ug/mg  Vitamin D 25-OH  Result Value Ref Range  Vitamin D Total (25OH) 38.5 20.0 - 80.0 ng/mL  Basic Metabolic Panel  Result Value Ref Range  Sodium 140 135 - 145 mmol/L  Potassium 5.2 (H) 3.5 - 5.0 mmol/L  Chloride 109 (H) 98 - 107 mmol/L  CO2 21.0 (L) 22.0 - 30.0 mmol/L  Anion Gap 10 7 - 15 mmol/L  BUN 43 (H) 7 - 21 mg/dL  Creatinine 1.55 (H) 0.70 - 1.30 mg/dL  BUN/Creatinine Ratio 28  EGFR CKD-EPI Non-African American, Male 43 (L) >=60 mL/min/1.17m  EGFR CKD-EPI African  American, Male 50 (L) >=60 mL/min/1.69m  Glucose 134 70 - 179 mg/dL  Calcium 8.7 8.5 - 10.2 mg/dL    Lab Results  Component Value Date  NA 140 05/30/2019  K 5.2 (H) 05/30/2019  CL 109 (H) 05/30/2019  CO2 21.0 (L) 05/30/2019  BUN 43 (H) 05/30/2019  CREATININE 1.55 (H) 05/30/2019  CREATININE 1.68 (H) 01/06/2019  CREATININE 1.59 (H) 04/23/2017  CREATININE 2.08 (H) 12/20/2015  CREATININE 1.87 (H) 08/19/2015   Lab Results  Component Value Date  PCRATIOUR 0.123 04/23/2017  WBC 9.7 04/23/2017  PLT 282 04/23/2017   No components found for: LASTPTH Lab Results  Component Value Date  WBC 9.7 04/23/2017  HGB 11.5 (L) 04/23/2017  HCT 34.5 (L) 04/23/2017  PLT 282 04/23/2017   Wt Readings from Last 3 Encounters:  06/10/19 76.4 kg (168 lb 8 oz)  01/06/19 77.7 kg (171 lb 3.2 oz)  04/23/17 75.2 kg (165 lb 11.2 oz)   IMAGING STUDIES:  Abd [place]11/06/2018 IMPRESSION:  Suspect diffuse fatty  infiltration of the liver but no focal hepatic  lesions or biliary dilatation.   Normal gallbladder and normal caliber common bile duct.   ASSESSMENT/PLAN: Thomas.Latasha SChauncey CruelJHardgeis a 74y.o. year old patient with  1. Stage 3b chronic kidney disease  2. Controlled type 2 diabetes mellitus without complication, without long-term current use of insulin (CMS-HCC)  3. Benign essential HTN  4. Hyperkalemia   1. CKD IIIa (G3a/A2). Believed to be 2/2 DM2 and HTN, although never biopsied. With only small amount of albuminuria and DM2 in general well controlled. Cr at lower end of his 1.5-2.0 BL at 1.55. - continue to avoid nephrotoxins, including NSAIDs - continue good DM2 and HTN control - he is on an ACEi - have tried to get on SGLT2i, which I would like for kidney protection, but he opted for dietary changes for now, and I think this is reasonable at this point, can broach again in future  2. HTN. BP improved from last visit, but still above goal. K 5.2, so will hold on increasing enalapril. - continue enalapril 20 - consider addition of amlodipine 5 next visit if still high  3. CKD-MBD. Vit d replete after high-dose supplementation - ok to decrease ergocalciferol 50K to monthly now - repeat Phos, PTH at next visit  4. Anemia. Hgb ok so far, not requiring ESAs - repeat with next labs  5. Electrolytes. K and CO2 both borderline - [place] 4-pack sampler given in case of elevated K in future - start on sodium bicarbonate '1300mg'$  at bedtime since he takes all his meds at night  6. Prevention. Declines statin due to bad experience his wife had with it.  7. DM2. On glipizide 10 and linaglliptin 5 with hgba1c back down to 6.9% (had gotten above 7) after improvements in diet. - eGFR ok to restart metformin if PCP wants, would do the low dose of '500mg'$  - would eventually like to get him on SGLT2i, but will hold until future visits as he was inclined to keep his meds as they are now with diet  control  Thomas.Lion S JBirwill follow up in Return in about 6 months (around 12/11/2019) for Next scheduled follow up. or sooner as needed.   I personally spent 36 minutes face-to-face and non-face-to-face in the care of this patient, which includes all pre, intra, and post visit time on the date of service.   Electronically signed by EEvangeline Dakin MD at 06/10/2019 9:41 AM EDT

## 2019-07-11 NOTE — Patient Instructions (Signed)
Your procedure is scheduled on: 07-21-19 [facility] Report to Same Day Surgery 2nd floor medical mall [facility] Entrance-take elevator on left to 2nd floor.  Check in with surgery information desk.) To find out your arrival time please call [phone] between 1PM - 3PM on 07-18-19 FRIDAY  Remember: Instructions that are not followed completely may result in serious medical risk, up to and including death, or upon the discretion of your surgeon and anesthesiologist your surgery may need to be rescheduled.    _x___ 1. Do not eat food after midnight the night before your procedure. NO GUM OR CANDY AFTER MIDNIGHT. You may drink WATER  up to 2 hours before you are scheduled to arrive at the hospital for your procedure.  Do not drink WATER within 2 hours of your scheduled arrival to the hospital.  Type 1 and type 2 diabetics should only drink water.   ____Ensure clear carbohydrate drink on the way to the hospital for bariatric patients  ____Ensure clear carbohydrate drink 3 hours before surgery.     __x__ 2. No Alcohol for 24 hours before or after surgery.   __x__3. No Smoking or e-cigarettes for 24 prior to surgery.  Do not use any chewable tobacco products for at least 6 hour prior to surgery   ____  4. Bring all medications with you on the day of surgery if instructed.    __x__ 5. Notify your doctor if there is any change in your medical condition     (cold, fever, infections).    x___6. On the morning of surgery brush your teeth with toothpaste and water.  You may rinse your mouth with mouth wash if you wish.  Do not swallow any toothpaste or mouthwash.   Do not wear jewelry, make-up, hairpins, clips or nail polish.  Do not wear lotions, powders, or perfumes.   Do not shave 48 hours prior to surgery. Men may shave face and neck.  Do not bring valuables to the hospital.    [facility] is not responsible for any belongings or valuables.               Contacts, dentures or bridgework  may not be worn into surgery.  Leave your suitcase in the car. After surgery it may be brought to your room.  For patients admitted to the hospital, discharge time is determined by your treatment team.  _  Patients discharged the day of surgery will not be allowed to drive home.  You will need someone to drive you home and stay with you the night of your procedure.    Please read over the following fact sheets that you were given:   [facility] Preparing for Surgery   _x___ TAKE THE FOLLOWING MEDICATION THE MORNING OF SURGERY WITH A SMALL SIP OF WATER. These include:  1. NONE  2.  3.  4.  5.  6.  ____Fleets enema or Magnesium Citrate as directed.   _x___ Use CHG Soap or sage wipes as directed on instruction sheet   ____ Use inhalers on the day of surgery and bring to hospital day of surgery  ____ Stop Metformin and Janumet 2 days prior to surgery.    ____ Take 1/2 of usual insulin dose the night before surgery and none on the morning surgery.   _x___ Follow recommendations from Cardiologist, Pulmonologist or PCP regarding stopping Aspirin, Coumadin, Plavix ,Eliquis, Effient, or Pradaxa, and Pletal-STOP ASPIRIN 7 DAYS PRIOR TO SURGERY  X____Stop Anti-inflammatories such as Advil,  Aleve, Ibuprofen, Motrin, Naproxen, Naprosyn, Goodies powders or aspirin products 7 DAYS PRIOR TO SURGERY-OK to take Tylenol   ____ Stop supplements until after surgery.   ____ Bring C-Pap to the hospital.

## 2019-07-14 ENCOUNTER — Other Ambulatory Visit: Payer: Self-pay

## 2019-07-14 ENCOUNTER — Encounter
Admission: RE | Admit: 2019-07-14 | Discharge: 2019-07-14 | Disposition: A | Discharge status: Home / Self Care | Payer: Medicare HMO | Source: Ambulatory Visit | Attending: General Surgery | Admitting: General Surgery

## 2019-07-14 DIAGNOSIS — Z01818 Encounter for other preprocedural examination: Secondary | ICD-10-CM | POA: Diagnosis not present

## 2019-07-14 LAB — POTASSIUM: Potassium: 4.6 mmol/L (ref 3.5–5.1)

## 2019-07-17 ENCOUNTER — Other Ambulatory Visit
Admission: RE | Admit: 2019-07-17 | Discharge: 2019-07-17 | Disposition: A | Discharge status: Home / Self Care | Payer: Medicare HMO | Source: Ambulatory Visit | Attending: General Surgery | Admitting: General Surgery

## 2019-07-17 ENCOUNTER — Other Ambulatory Visit: Payer: Self-pay

## 2019-07-17 DIAGNOSIS — Z20822 Contact with and (suspected) exposure to covid-19: Secondary | ICD-10-CM | POA: Diagnosis not present

## 2019-07-17 DIAGNOSIS — Z01812 Encounter for preprocedural laboratory examination: Secondary | ICD-10-CM | POA: Insufficient documentation

## 2019-07-17 LAB — SARS CORONAVIRUS 2 (TAT 6-24 HRS): SARS Coronavirus 2: NEGATIVE

## 2019-07-21 ENCOUNTER — Ambulatory Visit
Admission: RE | Admit: 2019-07-21 | Discharge: 2019-07-21 | Disposition: A | Discharge status: Home / Self Care | Payer: Medicare HMO | Attending: General Surgery | Admitting: General Surgery

## 2019-07-21 ENCOUNTER — Ambulatory Visit: Payer: Medicare HMO | Admitting: Registered Nurse

## 2019-07-21 ENCOUNTER — Other Ambulatory Visit: Payer: Self-pay

## 2019-07-21 ENCOUNTER — Encounter
Admission: RE | Disposition: A | Discharge status: Home / Self Care | Payer: Self-pay | Source: Home / Self Care | Attending: General Surgery

## 2019-07-21 ENCOUNTER — Encounter: Payer: Self-pay | Admitting: General Surgery

## 2019-07-21 DIAGNOSIS — I129 Hypertensive chronic kidney disease with stage 1 through stage 4 chronic kidney disease, or unspecified chronic kidney disease: Secondary | ICD-10-CM | POA: Diagnosis not present

## 2019-07-21 DIAGNOSIS — Z7984 Long term (current) use of oral hypoglycemic drugs: Secondary | ICD-10-CM | POA: Insufficient documentation

## 2019-07-21 DIAGNOSIS — N183 Chronic kidney disease, stage 3 unspecified: Secondary | ICD-10-CM | POA: Insufficient documentation

## 2019-07-21 DIAGNOSIS — Z833 Family history of diabetes mellitus: Secondary | ICD-10-CM | POA: Diagnosis not present

## 2019-07-21 DIAGNOSIS — Z8249 Family history of ischemic heart disease and other diseases of the circulatory system: Secondary | ICD-10-CM | POA: Diagnosis not present

## 2019-07-21 DIAGNOSIS — K4091 Unilateral inguinal hernia, without obstruction or gangrene, recurrent: Secondary | ICD-10-CM | POA: Insufficient documentation

## 2019-07-21 DIAGNOSIS — E1122 Type 2 diabetes mellitus with diabetic chronic kidney disease: Secondary | ICD-10-CM | POA: Insufficient documentation

## 2019-07-21 DIAGNOSIS — Z79899 Other long term (current) drug therapy: Secondary | ICD-10-CM | POA: Diagnosis not present

## 2019-07-21 DIAGNOSIS — Z7982 Long term (current) use of aspirin: Secondary | ICD-10-CM | POA: Insufficient documentation

## 2019-07-21 HISTORY — PX: XI ROBOTIC ASSISTED INGUINAL HERNIA REPAIR WITH MESH: SHX6706

## 2019-07-21 LAB — GLUCOSE, CAPILLARY
Glucose-Capillary: 144 mg/dL — ABNORMAL HIGH (ref 70–99)
Glucose-Capillary: 269 mg/dL — ABNORMAL HIGH (ref 70–99)
Glucose-Capillary: 279 mg/dL — ABNORMAL HIGH (ref 70–99)

## 2019-07-21 SURGERY — REPAIR, HERNIA, INGUINAL, ROBOT-ASSISTED, LAPAROSCOPIC, USING MESH
Anesthesia: General | Site: Groin | Laterality: Left

## 2019-07-21 MED ORDER — PROPOFOL 10 MG/ML IV BOLUS
INTRAVENOUS | Status: DC | PRN
  Administered 2019-07-21: 120 mg via INTRAVENOUS

## 2019-07-21 MED ORDER — FENTANYL CITRATE (PF) 100 MCG/2ML IJ SOLN
INTRAMUSCULAR | Status: DC | PRN
  Administered 2019-07-21: 100 ug via INTRAVENOUS
  Administered 2019-07-21: 25 ug via INTRAVENOUS
  Administered 2019-07-21: 50 ug via INTRAVENOUS

## 2019-07-21 MED ORDER — SEVOFLURANE IN SOLN
RESPIRATORY_TRACT | Status: AC
  Filled 2019-07-21: qty 250

## 2019-07-21 MED ORDER — EPINEPHRINE PF 1 MG/ML IJ SOLN
INTRAMUSCULAR | Status: AC
  Filled 2019-07-21: qty 1

## 2019-07-21 MED ORDER — LACTATED RINGERS IV SOLN: INTRAVENOUS | Status: DC

## 2019-07-21 MED ORDER — INSULIN ASPART 100 UNIT/ML ~~LOC~~ SOLN: 3.0000 [IU] | Freq: Once | SUBCUTANEOUS | Status: AC

## 2019-07-21 MED ORDER — LIDOCAINE HCL (CARDIAC) PF 100 MG/5ML IV SOSY
PREFILLED_SYRINGE | INTRAVENOUS | Status: DC | PRN
  Administered 2019-07-21: 100 mg via INTRAVENOUS

## 2019-07-21 MED ORDER — SUGAMMADEX SODIUM 200 MG/2ML IV SOLN
INTRAVENOUS | Status: DC | PRN
  Administered 2019-07-21: 150 mg via INTRAVENOUS

## 2019-07-21 MED ORDER — BUPIVACAINE HCL (PF) 0.25 % IJ SOLN
INTRAMUSCULAR | Status: AC
  Filled 2019-07-21: qty 30

## 2019-07-21 MED ORDER — INSULIN ASPART 100 UNIT/ML ~~LOC~~ SOLN
SUBCUTANEOUS | Status: AC
  Filled 2019-07-21: qty 1

## 2019-07-21 MED ORDER — INSULIN ASPART 100 UNIT/ML ~~LOC~~ SOLN
SUBCUTANEOUS | Status: AC
  Administered 2019-07-21: 3 [IU] via SUBCUTANEOUS
  Filled 2019-07-21: qty 1

## 2019-07-21 MED ORDER — ROCURONIUM BROMIDE 10 MG/ML (PF) SYRINGE
PREFILLED_SYRINGE | INTRAVENOUS | Status: AC
  Filled 2019-07-21: qty 10

## 2019-07-21 MED ORDER — CHLORHEXIDINE GLUCONATE 0.12 % MT SOLN
OROMUCOSAL | Status: AC
  Filled 2019-07-21: qty 15

## 2019-07-21 MED ORDER — CHLORHEXIDINE GLUCONATE 0.12 % MT SOLN
15.0000 mL | Freq: Once | OROMUCOSAL | Status: AC
  Administered 2019-07-21: 15 mL via OROMUCOSAL

## 2019-07-21 MED ORDER — PROPOFOL 10 MG/ML IV BOLUS
INTRAVENOUS | Status: AC
  Filled 2019-07-21: qty 20

## 2019-07-21 MED ORDER — GLIPIZIDE ER 10 MG PO TB24
10.0000 mg | ORAL_TABLET | Freq: Every day | ORAL | Status: DC
  Administered 2019-07-21: 10 mg via ORAL
  Filled 2019-07-21: qty 1

## 2019-07-21 MED ORDER — BUPIVACAINE-EPINEPHRINE 0.25% -1:200000 IJ SOLN
INTRAMUSCULAR | Status: DC | PRN
  Administered 2019-07-21 (×2): 15 mL

## 2019-07-21 MED ORDER — ACETAMINOPHEN 10 MG/ML IV SOLN
INTRAVENOUS | Status: AC
  Filled 2019-07-21: qty 100

## 2019-07-21 MED ORDER — PHENYLEPHRINE HCL (PRESSORS) 10 MG/ML IV SOLN
INTRAVENOUS | Status: AC
  Filled 2019-07-21: qty 1

## 2019-07-21 MED ORDER — EPHEDRINE 5 MG/ML INJ
INTRAVENOUS | Status: AC
  Filled 2019-07-21: qty 10

## 2019-07-21 MED ORDER — SODIUM CHLORIDE 0.9 % IV SOLN: INTRAVENOUS | Status: DC

## 2019-07-21 MED ORDER — FAMOTIDINE 20 MG PO TABS
ORAL_TABLET | ORAL | Status: AC
  Administered 2019-07-21: 20 mg via ORAL
  Filled 2019-07-21: qty 1

## 2019-07-21 MED ORDER — LABETALOL HCL 5 MG/ML IV SOLN
INTRAVENOUS | Status: DC | PRN
  Administered 2019-07-21: 5 mg via INTRAVENOUS

## 2019-07-21 MED ORDER — FENTANYL CITRATE (PF) 100 MCG/2ML IJ SOLN: 25.0000 ug | INTRAMUSCULAR | Status: DC | PRN

## 2019-07-21 MED ORDER — FENTANYL CITRATE (PF) 250 MCG/5ML IJ SOLN
INTRAMUSCULAR | Status: AC
  Filled 2019-07-21: qty 5

## 2019-07-21 MED ORDER — ACETAMINOPHEN 10 MG/ML IV SOLN
INTRAVENOUS | Status: DC | PRN
  Administered 2019-07-21: 1000 mg via INTRAVENOUS

## 2019-07-21 MED ORDER — FAMOTIDINE 20 MG PO TABS: 20.0000 mg | ORAL_TABLET | Freq: Once | ORAL | Status: AC

## 2019-07-21 MED ORDER — ONDANSETRON HCL 4 MG/2ML IJ SOLN
INTRAMUSCULAR | Status: DC | PRN
  Administered 2019-07-21: 4 mg via INTRAVENOUS

## 2019-07-21 MED ORDER — LIDOCAINE HCL (PF) 2 % IJ SOLN
INTRAMUSCULAR | Status: AC
  Filled 2019-07-21: qty 5

## 2019-07-21 MED ORDER — SUCCINYLCHOLINE CHLORIDE 200 MG/10ML IV SOSY
PREFILLED_SYRINGE | INTRAVENOUS | Status: AC
  Filled 2019-07-21: qty 10

## 2019-07-21 MED ORDER — HYDROCODONE-ACETAMINOPHEN 5-325 MG PO TABS: 1.0000 | ORAL_TABLET | ORAL | 0 refills | Status: AC | PRN

## 2019-07-21 MED ORDER — ORAL CARE MOUTH RINSE: 15.0000 mL | Freq: Once | OROMUCOSAL | Status: AC

## 2019-07-21 MED ORDER — CEFAZOLIN SODIUM-DEXTROSE 2-4 GM/100ML-% IV SOLN
2.0000 g | INTRAVENOUS | Status: AC
  Administered 2019-07-21: 2 g via INTRAVENOUS

## 2019-07-21 MED ORDER — ONDANSETRON HCL 4 MG/2ML IJ SOLN: 4.0000 mg | Freq: Once | INTRAMUSCULAR | Status: DC | PRN

## 2019-07-21 MED ORDER — EPHEDRINE SULFATE 50 MG/ML IJ SOLN
INTRAMUSCULAR | Status: DC | PRN
  Administered 2019-07-21 (×2): 10 mg via INTRAVENOUS

## 2019-07-21 MED ORDER — GLIPIZIDE ER 5 MG PO TB24
5.0000 mg | ORAL_TABLET | Freq: Every day | ORAL | Status: DC
  Filled 2019-07-21: qty 1

## 2019-07-21 MED ORDER — CEFAZOLIN SODIUM-DEXTROSE 2-4 GM/100ML-% IV SOLN
INTRAVENOUS | Status: AC
  Filled 2019-07-21: qty 100

## 2019-07-21 MED ORDER — ONDANSETRON HCL 4 MG/2ML IJ SOLN
INTRAMUSCULAR | Status: AC
  Filled 2019-07-21: qty 2

## 2019-07-21 MED ORDER — LABETALOL HCL 5 MG/ML IV SOLN
INTRAVENOUS | Status: AC
  Filled 2019-07-21: qty 4

## 2019-07-21 MED ORDER — ROCURONIUM BROMIDE 100 MG/10ML IV SOLN
INTRAVENOUS | Status: DC | PRN
  Administered 2019-07-21: 10 mg via INTRAVENOUS
  Administered 2019-07-21: 40 mg via INTRAVENOUS

## 2019-07-21 MED ORDER — INSULIN ASPART 100 UNIT/ML ~~LOC~~ SOLN
4.0000 [IU] | Freq: Once | SUBCUTANEOUS | Status: AC
  Administered 2019-07-21: 4 [IU] via SUBCUTANEOUS

## 2019-07-21 SURGICAL SUPPLY — 49 items
APPLICATOR CHLORAPREP 10 TEAL (MISCELLANEOUS) IMPLANT
BAG INFUSER PRESSURE 100CC (MISCELLANEOUS) IMPLANT
BLADE SURG SZ11 CARB STEEL (BLADE) ×3 IMPLANT
CANISTER SUCT 1200ML W/VALVE (MISCELLANEOUS) ×3 IMPLANT
CHLORAPREP W/TINT 26 (MISCELLANEOUS) ×3 IMPLANT
COVER TIP SHEARS 8 DVNC (MISCELLANEOUS) ×1 IMPLANT
COVER TIP SHEARS 8MM DA VINCI (MISCELLANEOUS) ×2
COVER WAND RF STERILE (DRAPES) ×6 IMPLANT
DEFOGGER SCOPE WARMER CLEARIFY (MISCELLANEOUS) ×3 IMPLANT
DERMABOND ADVANCED (GAUZE/BANDAGES/DRESSINGS) ×2
DERMABOND ADVANCED .7 DNX12 (GAUZE/BANDAGES/DRESSINGS) ×1 IMPLANT
DRAPE ARM DVNC X/XI (DISPOSABLE) ×3 IMPLANT
DRAPE COLUMN DVNC XI (DISPOSABLE) ×1 IMPLANT
DRAPE DA VINCI XI ARM (DISPOSABLE) ×6
DRAPE DA VINCI XI COLUMN (DISPOSABLE) ×2
ELECT REM PT RETURN 9FT ADLT (ELECTROSURGICAL) ×3
ELECTRODE REM PT RTRN 9FT ADLT (ELECTROSURGICAL) ×1 IMPLANT
GLOVE BIO SURGEON STRL SZ 6.5 (GLOVE) ×4 IMPLANT
GLOVE BIO SURGEONS STRL SZ 6.5 (GLOVE) ×2
GLOVE BIOGEL PI IND STRL 6.5 (GLOVE) ×2 IMPLANT
GLOVE BIOGEL PI INDICATOR 6.5 (GLOVE) ×4
GOWN STRL REUS W/ TWL LRG LVL3 (GOWN DISPOSABLE) ×3 IMPLANT
GOWN STRL REUS W/TWL LRG LVL3 (GOWN DISPOSABLE) ×6
IRRIGATOR SUCT 8 DISP DVNC XI (IRRIGATION / IRRIGATOR) IMPLANT
IRRIGATOR SUCTION 8MM XI DISP (IRRIGATION / IRRIGATOR)
IV CATH ANGIO 12GX3 LT BLUE (NEEDLE) IMPLANT
IV NS 1000ML (IV SOLUTION)
IV NS 1000ML BAXH (IV SOLUTION) IMPLANT
KIT PINK PAD W/HEAD ARE REST (MISCELLANEOUS) ×3
KIT PINK PAD W/HEAD ARM REST (MISCELLANEOUS) ×1 IMPLANT
LABEL OR SOLS (LABEL) IMPLANT
MESH 3DMAX 4X6 LT LRG (Mesh General) ×2 IMPLANT
MESH 3DMAX MID 4X6 LT LRG (Mesh General) ×1 IMPLANT
NEEDLE HYPO 22GX1.5 SAFETY (NEEDLE) ×3 IMPLANT
NEEDLE INSUFFLATION 14GA 120MM (NEEDLE) ×3 IMPLANT
OBTURATOR OPTICAL STANDARD 8MM (TROCAR) ×2
OBTURATOR OPTICAL STND 8 DVNC (TROCAR) ×1
OBTURATOR OPTICALSTD 8 DVNC (TROCAR) ×1 IMPLANT
PACK LAP CHOLECYSTECTOMY (MISCELLANEOUS) ×3 IMPLANT
SEAL CANN UNIV 5-8 DVNC XI (MISCELLANEOUS) ×3 IMPLANT
SEAL XI 5MM-8MM UNIVERSAL (MISCELLANEOUS) ×6
SET TUBE SMOKE EVAC HIGH FLOW (TUBING) ×3 IMPLANT
SOLUTION ELECTROLUBE (MISCELLANEOUS) ×3 IMPLANT
SUT MNCRL AB 4-0 PS2 18 (SUTURE) ×3 IMPLANT
SUT VIC AB 2-0 SH 27 (SUTURE) ×2
SUT VIC AB 2-0 SH 27XBRD (SUTURE) ×1 IMPLANT
SUT VLOC 90 S/L VL9 GS22 (SUTURE) ×3 IMPLANT
TAPE TRANSPORE STRL 2 31045 (GAUZE/BANDAGES/DRESSINGS) IMPLANT
TRAY FOLEY MTR SLVR 16FR STAT (SET/KITS/TRAYS/PACK) IMPLANT

## 2019-07-21 NOTE — Transfer of Care (Signed)
Immediate Anesthesia Transfer of Care Note  Patient: Thomas Rice  Procedure(s) Performed: XI ROBOTIC ASSISTED INGUINAL HERNIA REPAIR WITH MESH (Left Groin)  Patient Location: PACU  Anesthesia Type:General  Level of Consciousness: sedated  Airway & Oxygen Therapy: Patient Spontanous Breathing and Patient connected to face mask oxygen  Post-op Assessment: Report given to RN and Post -op Vital signs reviewed and stable  Post vital signs: Reviewed and stable  Last Vitals:  Vitals Value Taken Time  BP 106/50 07/21/19 0945  Temp 36.3 C 07/21/19 0940  Pulse 70 07/21/19 0945  Resp 15 07/21/19 0945  SpO2 100 % 07/21/19 0945  Vitals shown include unvalidated device data.  Last Pain:  Vitals:   07/21/19 0945  PainSc: 0-No pain         Complications: No complications documented.

## 2019-07-21 NOTE — Op Note (Signed)
Preoperative diagnosis: Left recurrent inguinal hernia.   Postoperative diagnosis: Left recurrent inguinal hernia.  Procedure: Robotic assisted Laparoscopic Transabdominal preperitoneal laparoscopic (TAPP) repair of left recurrent inguinal hernia.  Anesthesia: GETA  Surgeon: Dr. Hazle Quant  Wound Classification: Clean  Indications:  Patient is a 74 y.o. male developed a symptomatic and recurrent left inguinal hernia. Repair was indicated.  Findings: 1. Left indirect recurrent inguinal hernia identified 2. Vas deferens and cord structures identified and preserved 3. Bard 3D Max mesh used for repair 4. Adequate hemostasis.   Description of procedure: The patient was taken to the operating room and the correct side of surgery was verified. The patient was placed supine with arms tucked at the sides. After obtaining adequate anesthesia, the patient's abdomen was prepped and draped in standard sterile fashion. The patient was placed in the Trendelenburg position. A time-out was completed verifying correct patient, procedure, site, positioning, and implant(s) and/or special equipment prior to beginning this procedure. A Veress needle was placed at the umbilicus and pneumoperitoneum created with insufflation of carbon dioxide to 15 mmHg. After the Veress needle was removed, an 8-mm trocar was placed on epigastric area and the 30 angled laparoscope inserted. Two 8-mm trocars were then placed lateral to the rectus sheath under direct visualization. Both inguinal regions were inspected and the median umbilical ligament, medial umbilical ligament, and lateral umbilical fold were identified.  The robotic arms were docked. The robotic scope was inserted and the pelvic area anatomy targeted.  The peritoneum was incised with scissors along a line 5 cm above the superior edge of the hernia defect, extending from the median umbilical ligament to the anterior superior iliac spine. The peritoneal flap was  mobilized inferiorly using blunt and sharp dissection. The inferior epigastric vessels were exposed and the pubic symphysis was identified. Cooper's ligament was dissected to its junction with the iliac vein. The dissection was continued inferiorly to the iliopubic tract, with care taken to avoid injury to the femoral branch of the genitofemoral nerve and the lateral femoral cutaneous nerve. The cord structures were parietalized. The hernia was identified and reduced by gentle traction.  The left indirect hernia sac and cord lipoma were noted mobilized from the cord structures and reduced into the peritoneal cavity.  A large piece of mesh was rolled longitudinally into a compact cylinder and passed through a trocar. The cylinder was placed along the inferior aspect of the working space and unrolled into place to completely cover the direct, indirect, and femoral spaces. The mesh was secured into place superiorly to the anterior abdominal wall and inferiorly and medially to Cooper's ligament with absorbable sutures. Care was taken to avoid the inferolateral triangles containing the iliac vessels and genital nerves. The peritoneal flap was closed over the mesh and secured with suture in similar positions of safety. After ensuring adequate hemostasis, the trocars were removed and the pneumoperitoneum allowed to escape. The trocar incisions were closed using monocryl and skin adhesive dressings applied.  The patient tolerated the procedure well and was taken to the postanesthesia care unit in stable condition.   Specimen: None  Complications: None  Estimated Blood Loss: 5 mL

## 2019-07-21 NOTE — Discharge Instructions (Signed)
  Diet: Resume home heart healthy regular diet.   Activity: No heavy lifting >20 pounds (children, pets, laundry, garbage) or strenuous activity until follow-up, but light activity and walking are encouraged. Do not drive or drink alcohol if taking narcotic pain medications.  Wound care: May shower with soapy water and pat dry (do not rub incisions), but no baths or submerging incision underwater until follow-up. (no swimming)   Medications: Resume all home medications. For mild to moderate pain: acetaminophen (Tylenol) or ibuprofen (if no kidney disease). Combining Tylenol with alcohol can substantially increase your risk of causing liver disease. Narcotic pain medications, if prescribed, can be used for severe pain, though may cause nausea, constipation, and drowsiness. Do not combine Tylenol and Norco within a 6 hour period as Norco contains Tylenol. If you do not need the narcotic pain medication, you do not need to fill the prescription.  Call office ([phone]) at any time if any questions, worsening pain, fevers/chills, bleeding, drainage from incision site, or other concerns.   AMBULATORY SURGERY  DISCHARGE INSTRUCTIONS   1) The drugs that you were given will stay in your system until tomorrow so for the next 24 hours you should not:  A) Drive an automobile B) Make any legal decisions C) Drink any alcoholic beverage   2) You may resume regular meals tomorrow.  Today it is better to start with liquids and gradually work up to solid foods.  You may eat anything you prefer, but it is better to start with liquids, then soup and crackers, and gradually work up to solid foods.   3) Please notify your doctor immediately if you have any unusual bleeding, trouble breathing, redness and pain at the surgery site, drainage, fever, or pain not relieved by medication.    4) Additional Instructions:        Please contact your physician with any problems or Same Day Surgery at  [phone], Monday through Friday 6 am to 4 pm, or [place] at [place] Main number at [phone]. 

## 2019-07-21 NOTE — Anesthesia Procedure Notes (Addendum)
Procedure Name: Intubation Date/Time: 07/21/2019 7:40 AM Performed by: Karoline Caldwell, CRNA Pre-anesthesia Checklist: Patient identified, Patient being monitored, Timeout performed, Emergency Drugs available and Suction available Patient Re-evaluated:Patient Re-evaluated prior to induction Oxygen Delivery Method: Circle system utilized Preoxygenation: Pre-oxygenation with 100% oxygen Induction Type: IV induction Ventilation: Mask ventilation without difficulty Laryngoscope Size: McGraph and 4 Grade View: Grade I Tube type: Oral Tube size: 7.5 mm Number of attempts: 1 Airway Equipment and Method: Stylet Placement Confirmation: ETT inserted through vocal cords under direct vision,  positive ETCO2 and breath sounds checked- equal and bilateral Secured at: 22 cm Tube secured with: Tape Dental Injury: Teeth and Oropharynx as per pre-operative assessment

## 2019-07-21 NOTE — Interval H&P Note (Signed)
History and Physical Interval Note:  07/21/2019 6:57 AM  Thomas Rice  has presented today for surgery, with the diagnosis of K40.91 unilateral recurrent inguinal hernia w/o obstruction or gangrene.  The various methods of treatment have been discussed with the patient and family. After consideration of risks, benefits and other options for treatment, the patient has consented to  Procedure(s): XI ROBOTIC ASSISTED INGUINAL HERNIA REPAIR WITH MESH (Left) as a surgical intervention.  The patient's history has been reviewed, patient examined, no change in status, stable for surgery.  I have reviewed the patient's chart and labs.  Left groin marked in the pre procedure room. Questions were answered to the patient's satisfaction.     Carolan Shiver

## 2019-07-21 NOTE — Anesthesia Preprocedure Evaluation (Addendum)
Anesthesia Evaluation  Patient identified by MRN, date of birth, ID band Patient awake    Reviewed: Allergy & Precautions, NPO status , Patient's Chart, lab work & pertinent test results  Airway Mallampati: II  TM Distance: >3 FB     Dental  (+) Partial Upper, Chipped   Pulmonary neg pulmonary ROS,    Pulmonary exam normal        Cardiovascular hypertension, Pt. on medications Normal cardiovascular exam     Neuro/Psych negative neurological ROS  negative psych ROS   GI/Hepatic negative GI ROS,   Endo/Other  diabetes, Well Controlled  Renal/GU Renal InsufficiencyRenal disease  negative genitourinary   Musculoskeletal negative musculoskeletal ROS (+)   Abdominal Normal abdominal exam  (+)   Peds negative pediatric ROS (+)  Hematology  (+) anemia ,   Anesthesia Other Findings Past Medical History: No date: Anemia No date: Bursitis No date: Chronic kidney disease     Comment:  STAGE 3 No date: Diabetes mellitus without complication (HCC) No date: Hyperkalemia No date: Hypertension  Reproductive/Obstetrics                             Anesthesia Physical Anesthesia Plan  ASA: III  Anesthesia Plan: General   Post-op Pain Management:    Induction:   PONV Risk Score and Plan:   Airway Management Planned: Oral ETT  Additional Equipment:   Intra-op Plan:   Post-operative Plan: Extubation in OR  Informed Consent: I have reviewed the patients History and Physical, chart, labs and discussed the procedure including the risks, benefits and alternatives for the proposed anesthesia with the patient or authorized representative who has indicated his/her understanding and acceptance.     Dental advisory given  Plan Discussed with: CRNA and Surgeon  Anesthesia Plan Comments:         Anesthesia Quick Evaluation

## 2019-07-22 ENCOUNTER — Encounter: Payer: Self-pay | Admitting: General Surgery

## 2019-07-22 NOTE — Anesthesia Postprocedure Evaluation (Signed)
Anesthesia Post Note  Patient: Thomas Rice  Procedure(s) Performed: XI ROBOTIC ASSISTED INGUINAL HERNIA REPAIR WITH MESH (Left Groin)  Patient location during evaluation: PACU Anesthesia Type: General Level of consciousness: awake and alert and oriented Pain management: pain level controlled Vital Signs Assessment: post-procedure vital signs reviewed and stable Respiratory status: spontaneous breathing Cardiovascular status: blood pressure returned to baseline Anesthetic complications: no   No complications documented.   Last Vitals:  Vitals:   07/21/19 1101 07/21/19 1146  BP: 131/62 (!) 156/84  Pulse: 67 75  Resp: 16 18  Temp: 36.6 C   SpO2: 100% 100%    Last Pain:  Vitals:   07/21/19 1146  TempSrc:   PainSc: 0-No pain                 Robby Pirani

## 2020-11-09 ENCOUNTER — Emergency Department: Payer: Medicare HMO

## 2020-11-09 ENCOUNTER — Emergency Department
Admission: EM | Admit: 2020-11-09 | Discharge: 2020-11-09 | Disposition: A | Discharge status: Home / Self Care | Payer: Medicare HMO | Attending: Student in an Organized Health Care Education/Training Program | Admitting: Student in an Organized Health Care Education/Training Program

## 2020-11-09 DIAGNOSIS — Z7982 Long term (current) use of aspirin: Secondary | ICD-10-CM | POA: Diagnosis not present

## 2020-11-09 DIAGNOSIS — Z79899 Other long term (current) drug therapy: Secondary | ICD-10-CM | POA: Insufficient documentation

## 2020-11-09 DIAGNOSIS — E1122 Type 2 diabetes mellitus with diabetic chronic kidney disease: Secondary | ICD-10-CM | POA: Diagnosis not present

## 2020-11-09 DIAGNOSIS — R079 Chest pain, unspecified: Secondary | ICD-10-CM | POA: Diagnosis present

## 2020-11-09 DIAGNOSIS — E875 Hyperkalemia: Secondary | ICD-10-CM | POA: Diagnosis not present

## 2020-11-09 DIAGNOSIS — N183 Chronic kidney disease, stage 3 unspecified: Secondary | ICD-10-CM | POA: Diagnosis not present

## 2020-11-09 DIAGNOSIS — Z7984 Long term (current) use of oral hypoglycemic drugs: Secondary | ICD-10-CM | POA: Insufficient documentation

## 2020-11-09 LAB — BASIC METABOLIC PANEL
Anion gap: 8 (ref 5–15)
Anion gap: 5 (ref 5–15)
BUN: 34 mg/dL — ABNORMAL HIGH (ref 8–23)
BUN: 36 mg/dL — ABNORMAL HIGH (ref 8–23)
CO2: 24 mmol/L (ref 22–32)
CO2: 25 mmol/L (ref 22–32)
Calcium: 8.5 mg/dL — ABNORMAL LOW (ref 8.9–10.3)
Calcium: 8.2 mg/dL — ABNORMAL LOW (ref 8.9–10.3)
Chloride: 102 mmol/L (ref 98–111)
Chloride: 105 mmol/L (ref 98–111)
Creatinine, Ser: 1.76 mg/dL — ABNORMAL HIGH (ref 0.61–1.24)
Creatinine, Ser: 1.72 mg/dL — ABNORMAL HIGH (ref 0.61–1.24)
GFR, Estimated: 40 mL/min — ABNORMAL LOW (ref >60)
GFR, Estimated: 41 mL/min — ABNORMAL LOW (ref >60)
Glucose, Bld: 233 mg/dL — ABNORMAL HIGH (ref 70–99)
Glucose, Bld: 205 mg/dL — ABNORMAL HIGH (ref 70–99)
Potassium: 5.4 mmol/L — ABNORMAL HIGH (ref 3.5–5.1)
Potassium: 5.3 mmol/L — ABNORMAL HIGH (ref 3.5–5.1)
Sodium: 134 mmol/L — ABNORMAL LOW (ref 135–145)
Sodium: 135 mmol/L (ref 135–145)

## 2020-11-09 LAB — CBC
HCT: 36.5 % — ABNORMAL LOW (ref 39.0–52.0)
Hemoglobin: 12.4 g/dL — ABNORMAL LOW (ref 13.0–17.0)
MCH: 31.2 pg (ref 26.0–34.0)
MCHC: 34 g/dL (ref 30.0–36.0)
MCV: 91.9 fL (ref 80.0–100.0)
Platelets: 201 10*3/uL (ref 150–400)
RBC: 3.97 MIL/uL — ABNORMAL LOW (ref 4.22–5.81)
RDW: 13 % (ref 11.5–15.5)
WBC: 6.9 10*3/uL (ref 4.0–10.5)
nRBC: 0 % (ref 0.0–0.2)

## 2020-11-09 LAB — TROPONIN I (HIGH SENSITIVITY)
Troponin I (High Sensitivity): 14 ng/L (ref <18)
Troponin I (High Sensitivity): 18 ng/L — ABNORMAL HIGH (ref <18)
Troponin I (High Sensitivity): 25 ng/L — ABNORMAL HIGH (ref <18)

## 2020-11-09 MED ORDER — PATIROMER SORBITEX CALCIUM 8.4 G PO PACK
16.8000 g | PACK | Freq: Every day | ORAL | Status: DC
  Administered 2020-11-09: 16.8 g via ORAL
  Filled 2020-11-09: qty 2

## 2020-11-09 MED ORDER — SODIUM CHLORIDE 0.9 % IV BOLUS
250.0000 mL | Freq: Once | INTRAVENOUS | Status: AC
  Administered 2020-11-09: 250 mL via INTRAVENOUS

## 2020-11-09 NOTE — ED Notes (Signed)
Patient given discharge instructions, all questions answered. Patient in possession of all belongings, directed to the discharge area  

## 2020-11-09 NOTE — ED Triage Notes (Signed)
Pt here with CP that started a week ago but has gotten worse. Pt also states that his left arm started to become numb after 3 days. Pt states that CP is left sided and radiates to left arm. PT denies N/V/D.

## 2020-11-09 NOTE — ED Notes (Signed)
Patient c/o chest pain and left arm numbness. States chest pain has improved but still having slight numbness on arm. MD at bedside

## 2020-11-09 NOTE — ED Provider Notes (Signed)
[facility] Emergency Department Provider Note    Event Date/Time   First MD Initiated Contact with Patient 11/09/20 1124     (approximate)  I have reviewed the triage vital signs and the nursing notes.   HISTORY  Chief Complaint Chest Pain    HPI Thomas Rice is a 75 y.o. male presents to the ER for evaluation of left arm pain as well as some intermittent chest pain.  States the arm pain started about 4 days ago primarily in the elbow area.  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  States he has been doing some heavy lifting.  Has a history of bursitis and states that it feels very similar in nature.  Because the chest pain he went to see his PCP to get checked out and was directed to the ER due to concern for cardiac chest pain.  He denies any chest pain or pressure at this time.  No shortness of breath.  Declining any medication for discomfort at this time.  Does have a history of hyperkalemia in the past has not required hospitalization.  Denies any pain or discomfort going through to his back.  Denies any abdominal pain.  Past Medical History:  Diagnosis Date   Anemia    Bursitis    Chronic kidney disease    STAGE 3   Diabetes mellitus without complication (HCC)    Hyperkalemia    Hypertension    No family history on file. Past Surgical History:  Procedure Laterality Date   EYE SURGERY Bilateral    CATARACT   HERNIA REPAIR Bilateral    INGUINAL   KNEE ARTHROSCOPY  1980's   XI ROBOTIC ASSISTED INGUINAL HERNIA REPAIR WITH MESH Left 07/21/2019   Procedure: XI ROBOTIC ASSISTED INGUINAL HERNIA REPAIR WITH MESH;  Surgeon: Carolan Shiver, MD;  Location: [facility] ORS;  Service: General;  Laterality: Left;   There are no problems to display for this patient.     Prior to Admission medications   Medication Sig Start Date End Date Taking? Authorizing Provider  aspirin EC 81 MG tablet Take 81 mg by mouth at bedtime.     [provider]  enalapril (VASOTEC) 20 MG tablet Take 20 mg by mouth at bedtime.    [provider]  ergocalciferol (VITAMIN D2) 1.25 MG (50000 UT) capsule Take 50,000 Units by mouth every 30 (thirty) days.    [provider]  glipiZIDE (GLUCOTROL XL) 5 MG 24 hr tablet Take 10 mg by mouth at bedtime.    [provider]  latanoprost (XALATAN) 0.005 % ophthalmic solution Place 1 drop into both eyes at bedtime. 06/04/19   [provider]  linagliptin (TRADJENTA) 5 MG TABS tablet Take 5 mg by mouth at bedtime.    [provider]    Allergies Patient has no known allergies.    Social History Social History   Tobacco Use   Smoking status: Never   Smokeless tobacco: Never  Vaping Use   Vaping Use: Never used  Substance Use Topics   Alcohol use: Never   Drug use: Never    Review of Systems Patient denies headaches, rhinorrhea, blurry vision, numbness, shortness of breath, chest pain, edema, cough, abdominal pain, nausea, vomiting, diarrhea, dysuria, fevers, rashes or hallucinations unless otherwise stated above in HPI. ____________________________________________   PHYSICAL EXAM:  VITAL SIGNS: Vitals:   11/09/20 1458 11/09/20 1545  BP: (!) 167/81 (!) 176/83  Pulse: 62 71  Resp: 17   Temp:  98.3 F (36.8 C)  SpO2: 100% 100%    Constitutional: Alert and oriented.  Eyes: Conjunctivae are normal.  Head: Atraumatic. Nose: No congestion/rhinnorhea. Mouth/Throat: Mucous membranes are moist.   Neck: No stridor. Painless ROM.  Cardiovascular: Normal rate, regular rhythm. Grossly normal heart sounds.  Good peripheral circulation throughout Respiratory: Normal respiratory effort.  No retractions. Lungs CTAB. Gastrointestinal: Soft and nontender. No distention. No abdominal bruits. No CVA tenderness. Genitourinary:  Musculoskeletal: No lower extremity tenderness nor edema.  No joint effusions. Neurologic:  Normal speech and  language. No gross focal neurologic deficits are appreciated. No facial droop Skin:  Skin is warm, dry and intact. No rash noted. Psychiatric: Mood and affect are normal. Speech and behavior are normal.  ____________________________________________   LABS (all labs ordered are listed, but only abnormal results are displayed)  Results for orders placed or performed during the hospital encounter of 11/09/20 (from the past 24 hour(s))  Basic metabolic panel     Status: Abnormal   Collection Time: 11/09/20 10:06 AM  Result Value Ref Range   Sodium 134 (L) 135 - 145 mmol/L   Potassium 5.4 (H) 3.5 - 5.1 mmol/L   Chloride 102 98 - 111 mmol/L   CO2 24 22 - 32 mmol/L   Glucose, Bld 233 (H) 70 - 99 mg/dL   BUN 34 (H) 8 - 23 mg/dL   Creatinine, Ser 4.78 (H) 0.61 - 1.24 mg/dL   Calcium 8.5 (L) 8.9 - 10.3 mg/dL   GFR, Estimated 40 (L) >60 mL/min   Anion gap 8 5 - 15  CBC     Status: Abnormal   Collection Time: 11/09/20 10:06 AM  Result Value Ref Range   WBC 6.9 4.0 - 10.5 K/uL   RBC 3.97 (L) 4.22 - 5.81 MIL/uL   Hemoglobin 12.4 (L) 13.0 - 17.0 g/dL   HCT 29.5 (L) 62.1 - 30.8 %   MCV 91.9 80.0 - 100.0 fL   MCH 31.2 26.0 - 34.0 pg   MCHC 34.0 30.0 - 36.0 g/dL   RDW 65.7 84.6 - 96.2 %   Platelets 201 150 - 400 K/uL   nRBC 0.0 0.0 - 0.2 %  Troponin I (High Sensitivity)     Status: None   Collection Time: 11/09/20 10:06 AM  Result Value Ref Range   Troponin I (High Sensitivity) 14 <18 ng/L  Basic metabolic panel     Status: Abnormal   Collection Time: 11/09/20 12:09 PM  Result Value Ref Range   Sodium 135 135 - 145 mmol/L   Potassium 5.3 (H) 3.5 - 5.1 mmol/L   Chloride 105 98 - 111 mmol/L   CO2 25 22 - 32 mmol/L   Glucose, Bld 205 (H) 70 - 99 mg/dL   BUN 36 (H) 8 - 23 mg/dL   Creatinine, Ser 9.52 (H) 0.61 - 1.24 mg/dL   Calcium 8.2 (L) 8.9 - 10.3 mg/dL   GFR, Estimated 41 (L) >60 mL/min   Anion gap 5 5 - 15  Troponin I (High Sensitivity)     Status: Abnormal   Collection Time:  11/09/20 12:09 PM  Result Value Ref Range   Troponin I (High Sensitivity) 18 (H) <18 ng/L  Troponin I (High Sensitivity)     Status: Abnormal   Collection Time: 11/09/20  2:14 PM  Result Value Ref Range   Troponin I (High Sensitivity) 25 (H) <18 ng/L   ____________________________________________  EKG My review and personal interpretation at Time:  9:57   Indication: chest pain  Rate: 80  Rhythm: sinus Axis: normal Other: normal intervals, no stemi ____________________________________________  RADIOLOGY  I personally reviewed all radiographic images ordered to evaluate for the above acute complaints and reviewed radiology reports and findings.  These findings were personally discussed with the patient.  Please see medical record for radiology report.  ____________________________________________   PROCEDURES  Procedure(s) performed:  Procedures    Critical Care performed: no ____________________________________________   INITIAL IMPRESSION / ASSESSMENT AND PLAN / ED COURSE  Pertinent labs & imaging results that were available during my care of the patient were reviewed by me and considered in my medical decision making (see chart for details).   DDX: ACS, pericarditis, esophagitis, boerhaaves, pe, dissection, pna, bronchitis, costochondritis   Thomas Rice is a 75 y.o. who presents to the ED with presentation as described above.  Patient clinically very well-appearing in no acute distress.  His EKG is nonischemic his initial troponin is negative.  He does have mild renal insufficiency and borderline elevated potassium.  We will give Veltassa as well as gentle IV hydration and recheck.  Does not have any EKG changes.  This does not seem consistent with dissection or PE.  His abdominal exam is soft and benign with no white count I doubt this is referred from his abdomen.  Clinical Course as of 11/09/20 1929  Tue Nov 09, 2020  1520 Discussed the patient's rising troponin  with him and I concern for cardiac etiology of his symptoms.  We discussed my recommendation that he be admitted to the hospital for further observation and further work-up.  Patient declining admission to the hospital and demonstrates understanding that these findings could reflect acute MI or cardiac etiology or vascular etiology of symptoms and that leaving his medical advice could result in further disability, pain and even death.  States that he is completely comfortable with accepting these risks and states that he will follow-up as an outpatient as he does not want to be hospitalized. [PR]    Clinical Course User Index [PR] Willy Eddy, MD    The patient was evaluated in Emergency Department today for the symptoms described in the history of present illness. He/she was evaluated in the context of the global COVID-19 pandemic, which necessitated consideration that the patient might be at risk for infection with the SARS-CoV-2 virus that causes COVID-19. Institutional protocols and algorithms that pertain to the evaluation of patients at risk for COVID-19 are in a state of rapid change based on information released by regulatory bodies including the CDC and federal and state organizations. These policies and algorithms were followed during the patient's care in the ED.  As part of my medical decision making, I reviewed the following data within the electronic MEDICAL RECORD NUMBER Nursing notes reviewed and incorporated, Labs reviewed, notes from prior ED visits and [place] Controlled Substance Database   ____________________________________________   FINAL CLINICAL IMPRESSION(S) / ED DIAGNOSES  Final diagnoses:  Chest pain, unspecified type  Hyperkalemia      NEW MEDICATIONS STARTED DURING THIS VISIT:  Discharge Medication List as of 11/09/2020  3:24 PM       Note:  This document was prepared using Dragon voice recognition software and may include unintentional dictation errors.     Willy Eddy, MD 11/09/20 1929

## 2020-11-09 NOTE — Discharge Instructions (Addendum)
Please follow up with cardiology.  Please follow up with PCP in 24 to 48 hrs for repeat bloodwork given elevated potassium.

## 2020-12-01 ENCOUNTER — Emergency Department: Payer: Medicare HMO

## 2020-12-01 ENCOUNTER — Encounter: Payer: Self-pay | Admitting: Internal Medicine

## 2020-12-01 ENCOUNTER — Other Ambulatory Visit: Payer: Self-pay

## 2020-12-01 ENCOUNTER — Inpatient Hospital Stay
Admission: EM | Admit: 2020-12-01 | Discharge: 2020-12-04 | DRG: 287 | Disposition: A | Discharge status: Home / Self Care | Payer: Medicare HMO | Attending: Student | Admitting: Student

## 2020-12-01 DIAGNOSIS — I200 Unstable angina: Secondary | ICD-10-CM

## 2020-12-01 DIAGNOSIS — I10 Essential (primary) hypertension: Secondary | ICD-10-CM | POA: Diagnosis not present

## 2020-12-01 DIAGNOSIS — E119 Type 2 diabetes mellitus without complications: Secondary | ICD-10-CM

## 2020-12-01 DIAGNOSIS — E875 Hyperkalemia: Secondary | ICD-10-CM | POA: Diagnosis present

## 2020-12-01 DIAGNOSIS — Z7984 Long term (current) use of oral hypoglycemic drugs: Secondary | ICD-10-CM

## 2020-12-01 DIAGNOSIS — N1831 Chronic kidney disease, stage 3a: Secondary | ICD-10-CM | POA: Diagnosis present

## 2020-12-01 DIAGNOSIS — I129 Hypertensive chronic kidney disease with stage 1 through stage 4 chronic kidney disease, or unspecified chronic kidney disease: Secondary | ICD-10-CM | POA: Diagnosis present

## 2020-12-01 DIAGNOSIS — R9439 Abnormal result of other cardiovascular function study: Secondary | ICD-10-CM

## 2020-12-01 DIAGNOSIS — E1122 Type 2 diabetes mellitus with diabetic chronic kidney disease: Secondary | ICD-10-CM | POA: Diagnosis present

## 2020-12-01 DIAGNOSIS — Z20822 Contact with and (suspected) exposure to covid-19: Secondary | ICD-10-CM | POA: Diagnosis present

## 2020-12-01 DIAGNOSIS — E559 Vitamin D deficiency, unspecified: Secondary | ICD-10-CM | POA: Diagnosis present

## 2020-12-01 DIAGNOSIS — Z7982 Long term (current) use of aspirin: Secondary | ICD-10-CM | POA: Diagnosis not present

## 2020-12-01 DIAGNOSIS — Z79899 Other long term (current) drug therapy: Secondary | ICD-10-CM

## 2020-12-01 DIAGNOSIS — N183 Chronic kidney disease, stage 3 unspecified: Secondary | ICD-10-CM | POA: Diagnosis present

## 2020-12-01 DIAGNOSIS — I25110 Atherosclerotic heart disease of native coronary artery with unstable angina pectoris: Secondary | ICD-10-CM | POA: Diagnosis present

## 2020-12-01 DIAGNOSIS — N179 Acute kidney failure, unspecified: Secondary | ICD-10-CM | POA: Diagnosis present

## 2020-12-01 DIAGNOSIS — R0789 Other chest pain: Secondary | ICD-10-CM | POA: Diagnosis present

## 2020-12-01 DIAGNOSIS — R079 Chest pain, unspecified: Secondary | ICD-10-CM | POA: Diagnosis not present

## 2020-12-01 LAB — BASIC METABOLIC PANEL
Anion gap: 5 (ref 5–15)
BUN: 41 mg/dL — ABNORMAL HIGH (ref 8–23)
CO2: 21 mmol/L — ABNORMAL LOW (ref 22–32)
Calcium: 8.7 mg/dL — ABNORMAL LOW (ref 8.9–10.3)
Chloride: 107 mmol/L (ref 98–111)
Creatinine, Ser: 1.74 mg/dL — ABNORMAL HIGH (ref 0.61–1.24)
GFR, Estimated: 40 mL/min — ABNORMAL LOW (ref >60)
Glucose, Bld: 286 mg/dL — ABNORMAL HIGH (ref 70–99)
Potassium: 5.3 mmol/L — ABNORMAL HIGH (ref 3.5–5.1)
Sodium: 133 mmol/L — ABNORMAL LOW (ref 135–145)

## 2020-12-01 LAB — CBC
HCT: 35.2 % — ABNORMAL LOW (ref 39.0–52.0)
Hemoglobin: 12.2 g/dL — ABNORMAL LOW (ref 13.0–17.0)
MCH: 31 pg (ref 26.0–34.0)
MCHC: 34.7 g/dL (ref 30.0–36.0)
MCV: 89.6 fL (ref 80.0–100.0)
Platelets: 211 10*3/uL (ref 150–400)
RBC: 3.93 MIL/uL — ABNORMAL LOW (ref 4.22–5.81)
RDW: 13.1 % (ref 11.5–15.5)
WBC: 9 10*3/uL (ref 4.0–10.5)
nRBC: 0 % (ref 0.0–0.2)

## 2020-12-01 LAB — TROPONIN I (HIGH SENSITIVITY)
Troponin I (High Sensitivity): 35 ng/L — ABNORMAL HIGH (ref <18)
Troponin I (High Sensitivity): 38 ng/L — ABNORMAL HIGH (ref <18)

## 2020-12-01 LAB — RESP PANEL BY RT-PCR (FLU A&B, COVID) ARPGX2
Influenza A by PCR: NEGATIVE
Influenza B by PCR: NEGATIVE
SARS Coronavirus 2 by RT PCR: NEGATIVE

## 2020-12-01 LAB — CBG MONITORING, ED: Glucose-Capillary: 78 mg/dL (ref 70–99)

## 2020-12-01 MED ORDER — ENALAPRIL MALEATE 10 MG PO TABS: 20.0000 mg | ORAL_TABLET | Freq: Every day | ORAL | Status: DC

## 2020-12-01 MED ORDER — HEPARIN SODIUM (PORCINE) 5000 UNIT/ML IJ SOLN
5000.0000 [IU] | Freq: Three times a day (TID) | INTRAMUSCULAR | Status: DC
  Administered 2020-12-02 – 2020-12-04 (×6): 5000 [IU] via SUBCUTANEOUS
  Filled 2020-12-01 (×6): qty 1

## 2020-12-01 MED ORDER — ACETAMINOPHEN 325 MG PO TABS: 650.0000 mg | ORAL_TABLET | ORAL | Status: DC | PRN

## 2020-12-01 MED ORDER — ONDANSETRON HCL 4 MG/2ML IJ SOLN: 4.0000 mg | Freq: Four times a day (QID) | INTRAMUSCULAR | Status: DC | PRN

## 2020-12-01 MED ORDER — MORPHINE SULFATE (PF) 2 MG/ML IV SOLN: 1.0000 mg | INTRAVENOUS | Status: DC | PRN

## 2020-12-01 MED ORDER — SODIUM CHLORIDE 0.9 % IV SOLN: Freq: Once | INTRAVENOUS | Status: AC

## 2020-12-01 MED ORDER — HYDRALAZINE HCL 20 MG/ML IJ SOLN: 10.0000 mg | Freq: Four times a day (QID) | INTRAMUSCULAR | Status: DC | PRN

## 2020-12-01 MED ORDER — NITROGLYCERIN 0.4 MG SL SUBL: 0.4000 mg | SUBLINGUAL_TABLET | SUBLINGUAL | Status: DC | PRN

## 2020-12-01 MED ORDER — INSULIN ASPART 100 UNIT/ML IJ SOLN
0.0000 [IU] | Freq: Three times a day (TID) | INTRAMUSCULAR | Status: DC
  Administered 2020-12-02 – 2020-12-03 (×2): 2 [IU] via SUBCUTANEOUS
  Administered 2020-12-04: 1 [IU] via SUBCUTANEOUS
  Filled 2020-12-01 (×4): qty 1

## 2020-12-01 MED ORDER — ASPIRIN EC 81 MG PO TBEC
81.0000 mg | DELAYED_RELEASE_TABLET | Freq: Every day | ORAL | Status: DC
  Administered 2020-12-02 – 2020-12-03 (×2): 81 mg via ORAL
  Filled 2020-12-01 (×2): qty 1

## 2020-12-01 MED ORDER — ATORVASTATIN CALCIUM 20 MG PO TABS
40.0000 mg | ORAL_TABLET | Freq: Once | ORAL | Status: AC
  Administered 2020-12-01: 40 mg via ORAL
  Filled 2020-12-01: qty 2

## 2020-12-01 NOTE — ED Triage Notes (Signed)
Pt presents to ED with orders to come to ED by cardiologist. Pt states he had a stress test and states he was told to come directly to ED.  Pt states he needs a heart cath in November but reports he will need it sooner.   Pt denies current chest pain. Pt also states his K+ was elevated and they needed to get his K+ regulated before he can do his heart cath. Pt is A&Ox4.

## 2020-12-01 NOTE — H&P (Signed)
History and Physical    MADDOCK FINIGAN PVX:[id number] DOB: 24-Jan-1946 DOA: 12/01/2020  PCP: Leanna Sato, MD    Patient coming from:  Home    Chief Complaint:  Abnormal labs.   HPI: Thomas Rice is a 75 y.o. male seen in ed with complaints of abnormal blood work. Chart reviewed with nursing note reviewed and ED MD note reviewed shows that patient has been having chest discomfort chest pain that has been going on for about a week and radiating to his left arm along with numbness and paresthesia.  Left arm pain specifically is also in the elbow.  He does report heavy lifting in and around the house patient went to his cardiologist and was referred here for abnormal labs and chest discomfort.  Patient otherwise denies any headaches blurred vision speech or gait issues, palpitation shortness of breath fevers chills flank pain abdominal pain skin or joint issue or urinary bladder or bowel issues.  Again review of system is negative for headaches blurred vision numbness shortness of breath chest palpitations edema cough abdominal pain nausea vomiting diarrhea fevers rashes or any other complaints.  Stress test at cards which was positive. Potassium was elevated. Dr. Beatrix Fetters -  cardiology sent pt to ed for abnormal stress test  with plan for Left heart cath in am.  Chart review does show patient has a scheduled left heart cath on November 3.  Pt has past medical history of anemia, CKD stage III, diabetes mellitus type 2, hypokalemia, hypertension.  Home medications include aspirin 81, Vasotec 20, vitamin D, glipizide 5, Xalatan eyedrops, Tradjenta 5.  ED Course:  Vitals:   12/01/20 2138 12/01/20 2200 12/01/20 2230 12/01/20 2300  BP: (!) 150/86 (!) 160/81 (!) 144/78 (!) 152/79  Pulse: 68 64 62 65  Resp: 17 19 18 18   Temp: (!) 97.5 F (36.4 C)     TempSrc: Oral     SpO2: 100% 100% 99% 100%  Weight:      Height:      In the emergency room patient is alert awake afebrile oxygenating  100% on room air, and vitals are stable.  BMP shows potassium of 5.4, sodium 134, glucose 233, creatinine of 1.76: Lab Results  Component Value Date   CREATININE 1.74 (H) 12/01/2020   CREATININE 1.72 (H) 11/09/2020   CREATININE 1.76 (H) 11/09/2020  CBC shows a normal white count of 6.9 hemoglobin of 12.4 and platelet count of 201. Initial troponin of 14, repeat troponin of 25, initial EKG shows sinus rhythm at 80, normal axis normal intervals no ST-T wave changes.  Due to patient's rising troponin and concerns for cardiac risk factors patient was referred to the hospital for an inpatient evaluation of his chest discomfort and abnormal labs.  Review of Systems:  Review of Systems  Constitutional: Negative.   HENT: Negative.    Eyes: Negative.   Respiratory: Negative.    Cardiovascular:  Positive for chest pain. Negative for palpitations, orthopnea, leg swelling and PND.  Gastrointestinal: Negative.   Genitourinary: Negative.   Musculoskeletal:  Positive for joint pain.  Skin: Negative.   Neurological: Negative.   Psychiatric/Behavioral: Negative.    All other systems reviewed and are negative.   Past Medical History:  Diagnosis Date   Anemia    Bursitis    Chronic kidney disease    STAGE 3   Diabetes mellitus without complication (HCC)    Hyperkalemia    Hypertension     Past Surgical History:  Procedure  Laterality Date   EYE SURGERY Bilateral    CATARACT   HERNIA REPAIR Bilateral    INGUINAL   KNEE ARTHROSCOPY  1980's   XI ROBOTIC ASSISTED INGUINAL HERNIA REPAIR WITH MESH Left 07/21/2019   Procedure: XI ROBOTIC ASSISTED INGUINAL HERNIA REPAIR WITH MESH;  Surgeon: Carolan Shiver, MD;  Location: [facility] ORS;  Service: General;  Laterality: Left;     reports that he has never smoked. He has never used smokeless tobacco. He reports that he does not drink alcohol and does not use drugs.  No Known Allergies  History reviewed. No pertinent family history.  Prior to  Admission medications   Medication Sig Start Date End Date Taking? Authorizing Provider  aspirin EC 81 MG tablet Take 81 mg by mouth at bedtime.    [provider]  enalapril (VASOTEC) 20 MG tablet Take 20 mg by mouth at bedtime.    [provider]  ergocalciferol (VITAMIN D2) 1.25 MG (50000 UT) capsule Take 50,000 Units by mouth every 30 (thirty) days.    [provider]  glipiZIDE (GLUCOTROL XL) 5 MG 24 hr tablet Take 10 mg by mouth at bedtime.    [provider]  latanoprost (XALATAN) 0.005 % ophthalmic solution Place 1 drop into both eyes at bedtime. 06/04/19   [provider]  linagliptin (TRADJENTA) 5 MG TABS tablet Take 5 mg by mouth at bedtime.    [provider]    Physical Exam: Vitals:   12/01/20 2138 12/01/20 2200 12/01/20 2230 12/01/20 2300  BP: (!) 150/86 (!) 160/81 (!) 144/78 (!) 152/79  Pulse: 68 64 62 65  Resp: 17 19 18 18   Temp: (!) 97.5 F (36.4 C)     TempSrc: Oral     SpO2: 100% 100% 99% 100%  Weight:      Height:       Physical Exam Vitals reviewed.  Constitutional:      General: He is not in acute distress.    Appearance: He is not ill-appearing, toxic-appearing or diaphoretic.  HENT:     Head: Normocephalic and atraumatic.     Right Ear: External ear normal.     Left Ear: External ear normal.     Nose: Nose normal.     Mouth/Throat:     Mouth: Mucous membranes are moist.  Eyes:     Extraocular Movements: Extraocular movements intact.     Pupils: Pupils are equal, round, and reactive to light.  Neck:     Vascular: No carotid bruit.  Cardiovascular:     Rate and Rhythm: Normal rate and regular rhythm.     Pulses: Normal pulses.     Heart sounds: Normal heart sounds.  Pulmonary:     Effort: Pulmonary effort is normal.     Breath sounds: Normal breath sounds.  Abdominal:     General: Bowel sounds are normal. There is no distension.     Palpations: Abdomen is soft. There is no mass.      Tenderness: There is no abdominal tenderness.     Hernia: No hernia is present.  Musculoskeletal:     Right lower leg: No edema.     Left lower leg: No edema.  Skin:    General: Skin is warm.     Findings: No lesion or rash.  Neurological:     General: No focal deficit present.     Mental Status: He is alert and oriented to person, place, and time.  Psychiatric:  Mood and Affect: Mood normal.        Behavior: Behavior normal.     Labs on Admission: I have personally reviewed following labs and imaging studies  No results for input(s): CKTOTAL, CKMB, TROPONINI in the last 72 hours. Lab Results  Component Value Date   WBC 9.0 12/01/2020   HGB 12.2 (L) 12/01/2020   HCT 35.2 (L) 12/01/2020   MCV 89.6 12/01/2020   PLT 211 12/01/2020    Recent Labs  Lab 12/01/20 1657  NA 133*  K 5.3*  CL 107  CO2 21*  BUN 41*  CREATININE 1.74*  CALCIUM 8.7*  GLUCOSE 286*   COVID-19 Labs No results for input(s): DDIMER, FERRITIN, LDH, CRP in the last 72 hours. Lab Results  Component Value Date   SARSCOV2NAA NEGATIVE 12/01/2020   SARSCOV2NAA NEGATIVE 07/17/2019    Radiological Exams on Admission: DG Chest 2 View  Result Date: 12/01/2020 CLINICAL DATA:  Chest pain EXAM: CHEST - 2 VIEW COMPARISON:  11/09/2020 FINDINGS: The heart size and mediastinal contours are within normal limits. Both lungs are clear. Degenerative changes of the spine. IMPRESSION: No active cardiopulmonary disease. Electronically Signed   By: Jasmine Pang M.D.   On: 12/01/2020 17:38    EKG: Independently reviewed.  initial EKG shows sinus rhythm at 80, normal axis normal intervals no ST-T wave changes.    Assessment/Plan Principal Problem:   Unstable angina (HCC) Active Problems:   Hypertension   Diabetes mellitus type 2, uncomplicated (HCC)   CKD (chronic kidney disease), stage III (HCC)  Unstable angina: -Pt has risk factor for CAD due to age, diabetes and dyslipidemia. -we will admit to  progressive cardiac unit.  - we will start pt on asa/ statin./ ntg sl prn/ morphine / o2 as needed. - Lovenox FD/ Plavix after d/w cardiology as pt is having LHC tomorrow - cardiology consult for dr. Beatrix Fetters or his group- have Secure chat messaged dr. Alcide Clever. - NPO. -Continuous cardiac monitoring and EKG for as needed chest pain. -currently pt is chest pain free if TNI keep rising we will start heparin gtt.   HTN: Blood pressure (!) 152/79, pulse 65, temperature (!) 97.5 F (36.4 C), temperature source Oral, resp. rate 18, height 5\' 9"  (1.753 m), weight 74.8 kg, SpO2 100 %. We will currently hold patient's home regimen of Vasotec and manage with hydralazine IV.  Diabetes mellitus type 2: Patient currently maintained on Glucotrol and Tradjenta. We will hold patient's medications and start patient on sliding scale and glycemic protocol and Accu-Cheks to prevent hypoglycemia.  Chronic kidney disease stage III: Lab Results  Component Value Date   CREATININE 1.74 (H) 12/01/2020   CREATININE 1.72 (H) 11/09/2020   CREATININE 1.76 (H) 11/09/2020  I will start patient on a normal saline with sodium bicarb drip overnight to prevent worsening and CIN.   DVT prophylaxis:  Heparin .  Code Status:  Full code    Family Communication:  Advay, [place]ante (Relative)  [phone] (Mobile)   Disposition Plan:  Home    Consults called:  Cardiology consult per am team.   Admission status: Inpatient.     [phone] MD [facility] [phone] How to contact the [facility] Attending or Consulting provider 7A - 7P or covering provider during after hours 7P -7A, for this patient.    Check the care team in [facility] and look for a) attending/consulting [facility] provider listed and b) the [facility] team listed Log into [email or web address] and use [facility] universal password to  access. If you do not have the password, please contact the hospital operator. Locate the [facility] provider you are looking for under  [facility] and page to a number that you can be directly reached. If you still have difficulty reaching the provider, please page the [facility] (Director on Call) for the Hospitalists listed on amion for assistance. [email or web address] Password [facility] 12/01/2020, 11:32 PM

## 2020-12-01 NOTE — ED Provider Notes (Signed)
[facility] Emergency Department Provider Note  ____________________________________________   I have reviewed the triage vital signs and the nursing notes.   HISTORY  Chief Complaint Abnormal Lab   History limited by: Not Limited   HPI Thomas Rice is a 75 y.o. male who presents to the emergency department today at the request of cardiologist because of concerns for abnormal stress test and hyperkalemia.  Patient states that he did have an episode of chest pain earlier this month.  When he followed up with his cardiologist he had a stress test performed.  Did not show significant abnormalities.  Patient denies any chest pain or discomfort today.   Records reviewed. Per medical record review patient has a history of DM, HTN.  Past Medical History:  Diagnosis Date   Anemia    Bursitis    Chronic kidney disease    STAGE 3   Diabetes mellitus without complication (HCC)    Hyperkalemia    Hypertension     There are no problems to display for this patient.   Past Surgical History:  Procedure Laterality Date   EYE SURGERY Bilateral    CATARACT   HERNIA REPAIR Bilateral    INGUINAL   KNEE ARTHROSCOPY  1980's   XI ROBOTIC ASSISTED INGUINAL HERNIA REPAIR WITH MESH Left 07/21/2019   Procedure: XI ROBOTIC ASSISTED INGUINAL HERNIA REPAIR WITH MESH;  Surgeon: Carolan Shiver, MD;  Location: [facility] ORS;  Service: General;  Laterality: Left;    Prior to Admission medications   Medication Sig Start Date End Date Taking? Authorizing Provider  aspirin EC 81 MG tablet Take 81 mg by mouth at bedtime.    [provider]  enalapril (VASOTEC) 20 MG tablet Take 20 mg by mouth at bedtime.    [provider]  ergocalciferol (VITAMIN D2) 1.25 MG (50000 UT) capsule Take 50,000 Units by mouth every 30 (thirty) days.    [provider]  glipiZIDE (GLUCOTROL XL) 5 MG 24 hr tablet Take 10 mg by mouth at bedtime.    [provider]   latanoprost (XALATAN) 0.005 % ophthalmic solution Place 1 drop into both eyes at bedtime. 06/04/19   [provider]  linagliptin (TRADJENTA) 5 MG TABS tablet Take 5 mg by mouth at bedtime.    [provider]    Allergies Patient has no known allergies.  No family history on file.  Social History Social History   Tobacco Use   Smoking status: Never   Smokeless tobacco: Never  Vaping Use   Vaping Use: Never used  Substance Use Topics   Alcohol use: Never   Drug use: Never    Review of Systems Constitutional: No fever/chills Eyes: No visual changes. ENT: No sore throat. Cardiovascular: Denies chest pain. Respiratory: Denies shortness of breath. Gastrointestinal: No abdominal pain.  No nausea, no vomiting.  No diarrhea.   Genitourinary: Negative for dysuria. Musculoskeletal: Negative for back pain. Skin: Negative for rash. Neurological: Negative for headaches, focal weakness or numbness.  ____________________________________________   PHYSICAL EXAM:  VITAL SIGNS: ED Triage Vitals [12/01/20 1655]  Enc Vitals Group     BP 125/69     Pulse Rate 76     Resp 18     Temp 98.4 F (36.9 C)     Temp Source Oral     SpO2 100 %     Weight 165 lb (74.8 kg)     Height 5\' 9"  (1.753 m)     Head Circumference  Peak Flow      Pain Score 0   Constitutional: Alert and oriented.  Eyes: Conjunctivae are normal.  ENT      Head: Normocephalic and atraumatic.      Nose: No congestion/rhinnorhea.      Mouth/Throat: Mucous membranes are moist.      Neck: No stridor. Hematological/Lymphatic/Immunilogical: No cervical lymphadenopathy. Cardiovascular: Normal rate, regular rhythm.  No murmurs, rubs, or gallops.  Respiratory: Normal respiratory effort without tachypnea nor retractions. Breath sounds are clear and equal bilaterally. No wheezes/rales/rhonchi. Gastrointestinal: Soft and non tender. No rebound. No guarding.  Genitourinary: Deferred Musculoskeletal:  Normal range of motion in all extremities. No lower extremity edema. Neurologic:  Normal speech and language. No gross focal neurologic deficits are appreciated.  Skin:  Skin is warm, dry and intact. No rash noted. Psychiatric: Mood and affect are normal. Speech and behavior are normal. Patient exhibits appropriate insight and judgment.  ____________________________________________    LABS (pertinent positives/negatives)  Trop hs 35 to 38 CBC wbc 9.0, hgb 12.2, plt 211 BMP na 133, k 5.3, glu 286, cr 1.74  ____________________________________________   EKG  I, Phineas Semen, attending physician, personally viewed and interpreted this EKG  EKG Time: 1658 Rate: 69 Rhythm: normal sinus rhythm Axis: normal Intervals: qtc 387 QRS: narrow ST changes: no st elevation Impression: normal ekg ____________________________________________    RADIOLOGY  CXR No active cardiopulmonary disease  ____________________________________________   PROCEDURES  Procedures  ____________________________________________   INITIAL IMPRESSION / ASSESSMENT AND PLAN / ED COURSE  Pertinent labs & imaging results that were available during my care of the patient were reviewed by me and considered in my medical decision making (see chart for details).   Patient presented to the emergency department today because of concerns for abnormal stress test as well as hyperkalemia.  Patient came at the request of his cardiologist.  Patient's potassium here in the emergency department not significantly elevated.  I did speak with Dr. Beatrix Fetters over the telephone.  He would like patient admitted so we can perform heart catheterization tomorrow.  Will start IV fluids given slight elevation of creatinine. Discussed plan with patient.  ___________________________________________   FINAL CLINICAL IMPRESSION(S) / ED DIAGNOSES  Final diagnoses:  Abnormal stress test     Note: This dictation was prepared with  Dragon dictation. Any transcriptional errors that result from this process are unintentional     Phineas Semen, MD 12/01/20 2212

## 2020-12-02 ENCOUNTER — Encounter
Admission: EM | Disposition: A | Discharge status: Home / Self Care | Payer: Self-pay | Source: Home / Self Care | Attending: Student

## 2020-12-02 ENCOUNTER — Other Ambulatory Visit: Payer: Self-pay

## 2020-12-02 DIAGNOSIS — I200 Unstable angina: Secondary | ICD-10-CM | POA: Diagnosis not present

## 2020-12-02 HISTORY — PX: LEFT HEART CATH AND CORONARY ANGIOGRAPHY: CATH118249

## 2020-12-02 LAB — IRON AND TIBC
Iron: 74 ug/dL (ref 45–182)
Saturation Ratios: 23 % (ref 17.9–39.5)
TIBC: 316 ug/dL (ref 250–450)
UIBC: 242 ug/dL

## 2020-12-02 LAB — CBG MONITORING, ED
Glucose-Capillary: 149 mg/dL — ABNORMAL HIGH (ref 70–99)
Glucose-Capillary: 157 mg/dL — ABNORMAL HIGH (ref 70–99)
Glucose-Capillary: 138 mg/dL — ABNORMAL HIGH (ref 70–99)

## 2020-12-02 LAB — LIPID PANEL
Cholesterol: 81 mg/dL (ref 0–200)
HDL: 33 mg/dL — ABNORMAL LOW (ref >40)
LDL Cholesterol: 32 mg/dL (ref 0–99)
Total CHOL/HDL Ratio: 2.5 RATIO
Triglycerides: 82 mg/dL (ref <150)
VLDL: 16 mg/dL (ref 0–40)

## 2020-12-02 LAB — BASIC METABOLIC PANEL
Anion gap: 7 (ref 5–15)
BUN: 32 mg/dL — ABNORMAL HIGH (ref 8–23)
CO2: 22 mmol/L (ref 22–32)
Calcium: 8.7 mg/dL — ABNORMAL LOW (ref 8.9–10.3)
Chloride: 107 mmol/L (ref 98–111)
Creatinine, Ser: 1.53 mg/dL — ABNORMAL HIGH (ref 0.61–1.24)
GFR, Estimated: 47 mL/min — ABNORMAL LOW (ref >60)
Glucose, Bld: 165 mg/dL — ABNORMAL HIGH (ref 70–99)
Potassium: 5.1 mmol/L (ref 3.5–5.1)
Sodium: 136 mmol/L (ref 135–145)

## 2020-12-02 LAB — GLUCOSE, CAPILLARY
Glucose-Capillary: 118 mg/dL — ABNORMAL HIGH (ref 70–99)
Glucose-Capillary: 109 mg/dL — ABNORMAL HIGH (ref 70–99)
Glucose-Capillary: 140 mg/dL — ABNORMAL HIGH (ref 70–99)

## 2020-12-02 LAB — FOLATE: Folate: 30 ng/mL (ref >5.9)

## 2020-12-02 LAB — CBC
HCT: 36.9 % — ABNORMAL LOW (ref 39.0–52.0)
Hemoglobin: 12.7 g/dL — ABNORMAL LOW (ref 13.0–17.0)
MCH: 31.4 pg (ref 26.0–34.0)
MCHC: 34.4 g/dL (ref 30.0–36.0)
MCV: 91.1 fL (ref 80.0–100.0)
Platelets: 199 10*3/uL (ref 150–400)
RBC: 4.05 MIL/uL — ABNORMAL LOW (ref 4.22–5.81)
RDW: 13.2 % (ref 11.5–15.5)
WBC: 6.7 10*3/uL (ref 4.0–10.5)
nRBC: 0 % (ref 0.0–0.2)

## 2020-12-02 LAB — HEMOGLOBIN A1C
Hgb A1c MFr Bld: 7.6 % — ABNORMAL HIGH (ref 4.8–5.6)
Mean Plasma Glucose: 171.42 mg/dL

## 2020-12-02 LAB — VITAMIN B12: Vitamin B-12: 238 pg/mL (ref 180–914)

## 2020-12-02 LAB — VITAMIN D 25 HYDROXY (VIT D DEFICIENCY, FRACTURES): Vit D, 25-Hydroxy: 22.36 ng/mL — ABNORMAL LOW (ref 30–100)

## 2020-12-02 LAB — TSH: TSH: 2.733 u[IU]/mL (ref 0.350–4.500)

## 2020-12-02 LAB — T4, FREE: Free T4: 0.87 ng/dL (ref 0.61–1.12)

## 2020-12-02 SURGERY — LEFT HEART CATH AND CORONARY ANGIOGRAPHY
Anesthesia: Moderate Sedation

## 2020-12-02 MED ORDER — SODIUM CHLORIDE 0.9% FLUSH: 3.0000 mL | INTRAVENOUS | Status: DC | PRN

## 2020-12-02 MED ORDER — MIDAZOLAM HCL 2 MG/2ML IJ SOLN
INTRAMUSCULAR | Status: AC
  Filled 2020-12-02: qty 2

## 2020-12-02 MED ORDER — LIDOCAINE HCL (PF) 1 % IJ SOLN
INTRAMUSCULAR | Status: DC | PRN
  Administered 2020-12-02: 2 mL

## 2020-12-02 MED ORDER — FENTANYL CITRATE (PF) 100 MCG/2ML IJ SOLN
INTRAMUSCULAR | Status: AC
  Filled 2020-12-02: qty 2

## 2020-12-02 MED ORDER — SODIUM CHLORIDE 0.9 % IV SOLN: INTRAVENOUS | Status: AC

## 2020-12-02 MED ORDER — MIDAZOLAM HCL 2 MG/2ML IJ SOLN
INTRAMUSCULAR | Status: DC | PRN
  Administered 2020-12-02: 1 mg via INTRAVENOUS

## 2020-12-02 MED ORDER — SODIUM CHLORIDE 0.9 % IV SOLN: 250.0000 mL | INTRAVENOUS | Status: DC | PRN

## 2020-12-02 MED ORDER — HEPARIN (PORCINE) IN NACL 1000-0.9 UT/500ML-% IV SOLN
INTRAVENOUS | Status: DC | PRN
  Administered 2020-12-02: 1000 mL

## 2020-12-02 MED ORDER — SODIUM CHLORIDE 0.9% FLUSH
3.0000 mL | INTRAVENOUS | Status: DC | PRN
  Administered 2020-12-02: 3 mL via INTRAVENOUS

## 2020-12-02 MED ORDER — HEPARIN SODIUM (PORCINE) 1000 UNIT/ML IJ SOLN
INTRAMUSCULAR | Status: DC | PRN
  Administered 2020-12-02: 4000 [IU] via INTRAVENOUS

## 2020-12-02 MED ORDER — ASPIRIN 81 MG PO CHEW
324.0000 mg | CHEWABLE_TABLET | ORAL | Status: AC
  Administered 2020-12-02: 324 mg via ORAL
  Filled 2020-12-02 (×2): qty 4

## 2020-12-02 MED ORDER — SODIUM CHLORIDE 0.9% FLUSH
3.0000 mL | Freq: Two times a day (BID) | INTRAVENOUS | Status: DC
  Administered 2020-12-02 – 2020-12-04 (×4): 3 mL via INTRAVENOUS

## 2020-12-02 MED ORDER — FENTANYL CITRATE (PF) 100 MCG/2ML IJ SOLN
INTRAMUSCULAR | Status: DC | PRN
  Administered 2020-12-02: 50 ug via INTRAVENOUS

## 2020-12-02 MED ORDER — SODIUM CHLORIDE 0.9 % IV SOLN: INTRAVENOUS | Status: DC

## 2020-12-02 MED ORDER — VERAPAMIL HCL 2.5 MG/ML IV SOLN
INTRAVENOUS | Status: AC
  Filled 2020-12-02: qty 2

## 2020-12-02 MED ORDER — SODIUM CHLORIDE 0.9% FLUSH
3.0000 mL | Freq: Two times a day (BID) | INTRAVENOUS | Status: DC
  Administered 2020-12-02 – 2020-12-03 (×2): 3 mL via INTRAVENOUS

## 2020-12-02 MED ORDER — HEPARIN SODIUM (PORCINE) 1000 UNIT/ML IJ SOLN
INTRAMUSCULAR | Status: AC
  Filled 2020-12-02: qty 1

## 2020-12-02 MED ORDER — HEPARIN (PORCINE) IN NACL 1000-0.9 UT/500ML-% IV SOLN
INTRAVENOUS | Status: AC
  Filled 2020-12-02: qty 1000

## 2020-12-02 MED ORDER — LIDOCAINE HCL 1 % IJ SOLN
INTRAMUSCULAR | Status: AC
  Filled 2020-12-02: qty 20

## 2020-12-02 MED ORDER — VERAPAMIL HCL 2.5 MG/ML IV SOLN
INTRAVENOUS | Status: DC | PRN
  Administered 2020-12-02: 2.5 mg via INTRA_ARTERIAL

## 2020-12-02 MED ORDER — IOHEXOL 350 MG/ML SOLN
INTRAVENOUS | Status: DC | PRN
  Administered 2020-12-02: 38 mL

## 2020-12-02 SURGICAL SUPPLY — 13 items
CATH INFINITI 5 FR JL3.5 (CATHETERS) ×2 IMPLANT
CATH INFINITI 5FR ANG PIGTAIL (CATHETERS) ×2 IMPLANT
CATH INFINITI JR4 5F (CATHETERS) ×2 IMPLANT
DEVICE RAD TR BAND REGULAR (VASCULAR PRODUCTS) ×2 IMPLANT
DRAPE BRACHIAL (DRAPES) ×2 IMPLANT
GLIDESHEATH SLEND SS 6F .021 (SHEATH) ×2 IMPLANT
KIT SYRINGE INJ CVI SPIKEX1 (MISCELLANEOUS) ×2 IMPLANT
PACK CARDIAC CATH (CUSTOM PROCEDURE TRAY) ×2 IMPLANT
PROTECTION STATION PRESSURIZED (MISCELLANEOUS) ×2
SET ATX SIMPLICITY (MISCELLANEOUS) ×2 IMPLANT
STATION PROTECTION PRESSURIZED (MISCELLANEOUS) ×1 IMPLANT
WIRE GUIDERIGHT .035X150 (WIRE) IMPLANT
WIRE HI TORQ VERSACORE J 260CM (WIRE) ×2 IMPLANT

## 2020-12-02 NOTE — Progress Notes (Signed)
[facility] Progress Note  Patient: Thomas Rice    WCB:[id number]  DOA: 12/01/2020     Date of Service: the patient was seen and examined on 12/02/2020  Chief Complaint  Patient presents with   Abnormal Lab   Brief hospital course: Thomas Rice is a 75 y.o. male seen in ed with complaints of abnormal blood work. Chart reviewed with nursing note reviewed and ED MD note reviewed shows that patient has been having chest discomfort chest pain that has been going on for about a week and radiating to his left arm along with numbness and paresthesia.  Left arm pain specifically is also in the elbow.  He does report heavy lifting in and around the house patient went to his cardiologist and was referred here for abnormal labs and chest discomfort.   Patient otherwise denies any headaches blurred vision speech or gait issues, palpitation shortness of breath fevers chills flank pain abdominal pain skin or joint issue or urinary bladder or bowel issues.  Again review of system is negative for headaches blurred vision numbness shortness of breath chest palpitations edema cough abdominal pain nausea vomiting diarrhea fevers rashes or any other complaints.   Stress test at cards which was positive. Potassium was elevated. Dr. Beatrix Fetters -  cardiology sent pt to ed for abnormal stress test  with plan for Left heart cath in am.  Chart review does show patient has a scheduled left heart cath on November 3.   Pt has past medical history of anemia, CKD stage III, diabetes mellitus type 2, hypokalemia, hypertension.  Home medications include aspirin 81, Vasotec 20, vitamin D, glipizide 5, Xalatan eyedrops, Tradjenta 5.   Assessment and Plan: Principal Problem:   Unstable angina (HCC) Active Problems:   Hypertension   Diabetes mellitus type 2, uncomplicated (HCC)   CKD (chronic kidney disease), stage III (HCC)   Unstable angina: -Pt has risk factor for CAD due to age, diabetes and dyslipidemia. -we  will admit to progressive cardiac unit.  - we will start pt on asa/ statin./ ntg sl prn/ morphine / o2 as needed. - Lovenox FD/ Plavix after d/w cardiology as pt is having LHC today  - cardiology consult for dr. Beatrix Fetters or his group- have Secure chat messaged dr. Alcide Clever. - NPO. -Continuous cardiac monitoring and EKG for as needed chest pain. -currently pt is chest pain free if TNI keep rising we will start heparin gtt.  Follow 2D echocardiogram   HTN: Blood pressure (!) 152/79, pulse 65, temperature (!) 97.5 F (36.4 C), temperature source Oral, resp. rate 18, height 5\' 9"  (1.753 m), weight 74.8 kg, SpO2 100 %. We will currently hold patient's home regimen of Vasotec and manage with hydralazine IV.   Diabetes mellitus type 2: Patient currently maintained on Glucotrol and Tradjenta. We will hold patient's medications and start patient on sliding scale and glycemic protocol and Accu-Cheks to prevent hypoglycemia.   Chronic kidney disease stage III: Recent Labs       Lab Results  Component Value Date    CREATININE 1.74 (H) 12/01/2020    CREATININE 1.72 (H) 11/09/2020    CREATININE 1.76 (H) 11/09/2020    I will start patient on a normal saline with sodium bicarb drip overnight to prevent worsening and CIN.    Body mass index is 24.37 kg/m.  Interventions:       Diet: Currently n.p.o., will resume diabetic diet once cleared by cardiology DVT Prophylaxis: Subcutaneous Heparin    Advance goals  of care discussion: Full code  Family Communication: family was not present at bedside, at the time of interview.  The pt provided permission to discuss medical plan with the family. Opportunity was given to ask question and all questions were answered satisfactorily.   Disposition:  Pt is from home, admitted with unstable angina, seen by cardiology, scheduled for cardiac cath today, which precludes a safe discharge. Discharge to home, when chest pain-free and cleared by  cardiology.  Subjective: No significant overnight events, patient was admitted due to unstable angina, currently patient is chest pain-free.  Awaiting for cardiac cath today.  Denies any active issues.  Physical Exam: General:  alert oriented to time, place, and person.  Appear in no distress, affect appropriate Eyes: PERRLA ENT: Oral Mucosa Clear, moist  Neck: no JVD,  Cardiovascular: S1 and S2 Present, no Murmur,  Respiratory: good respiratory effort, Bilateral Air entry equal and Decreased, no Crackles, no wheezes Abdomen: Bowel Sound present, Soft and no tenderness,  Skin: no rashes Extremities: no Pedal edema, no calf tenderness Neurologic: without any new focal findings Gait not checked due to patient safety concerns  Vitals:   12/02/20 0800 12/02/20 0900 12/02/20 1000 12/02/20 1500  BP: (!) 166/92 (!) 152/81 (!) 142/79 138/70  Pulse: 77 69 69 65  Resp: 20 14 19 18   Temp:    97.9 F (36.6 C)  TempSrc:    Oral  SpO2: 100% 99% 99% 98%  Weight:      Height:       No intake or output data in the 24 hours ending 12/02/20 1526 Filed Weights   12/01/20 1655  Weight: 74.8 kg    Data Reviewed: I have personally reviewed and interpreted daily labs, tele strips, imagings as discussed above. I reviewed all nursing notes, pharmacy notes, vitals, pertinent old records I have discussed plan of care as described above with RN and patient/family.  CBC: Recent Labs  Lab 12/01/20 1657 12/02/20 0541  WBC 9.0 6.7  HGB 12.2* 12.7*  HCT 35.2* 36.9*  MCV 89.6 91.1  PLT 211 199   Basic Metabolic Panel: Recent Labs  Lab 12/01/20 1657 12/02/20 0744  NA 133* 136  K 5.3* 5.1  CL 107 107  CO2 21* 22  GLUCOSE 286* 165*  BUN 41* 32*  CREATININE 1.74* 1.53*  CALCIUM 8.7* 8.7*    Studies: DG Chest 2 View  Result Date: 12/01/2020 CLINICAL DATA:  Chest pain EXAM: CHEST - 2 VIEW COMPARISON:  11/09/2020 FINDINGS: The heart size and mediastinal contours are within normal limits.  Both lungs are clear. Degenerative changes of the spine. IMPRESSION: No active cardiopulmonary disease. Electronically Signed   By: 01/09/2021 M.D.   On: 12/01/2020 17:38    Scheduled Meds:  [MAR Hold] aspirin EC  81 mg Oral QHS   [MAR Hold] heparin  5,000 Units Subcutaneous Q8H   [MAR Hold] insulin aspart  0-9 Units Subcutaneous TID WC   [MAR Hold] sodium chloride flush  3 mL Intravenous Q12H   Continuous Infusions:  sodium chloride     sodium chloride 50 mL/hr at 12/02/20 0753   sodium chloride 75 mL/hr at 12/02/20 1011   PRN Meds: sodium chloride, [MAR Hold] acetaminophen, [MAR Hold] hydrALAZINE, [MAR Hold]  morphine injection, [MAR Hold] nitroGLYCERIN, [MAR Hold] ondansetron (ZOFRAN) IV, sodium chloride flush  Time spent: 35 minutes  Author: 12/04/20. MD [facility] 12/02/2020 3:26 PM  To reach On-call, see care teams to locate the attending and reach out  to them via [email or web address]. If 7PM-7AM, please contact night-coverage If you still have difficulty reaching the attending provider, please page the [facility] (Director on Call) for [facility] on amion for assistance.

## 2020-12-02 NOTE — Consult Note (Signed)
[facility]  CARDIOLOGY CONSULT NOTE  Patient ID: JEOVANNI HEURING MRN: [id number] DOB/AGE: [birth year] 75 y.o.  Admit date: 12/01/2020 Referring Physician Dileep Lucianne Muss Primary Physician Leanna Sato, MD Primary Cardiologist Armando Reichert, MD Reason for Consultation High risk stress, AKI/CKD, need for LHC  HPI:  Thomas Rice is a 75 y.o.male with a h/o HTN, T2DM, CKD (BL Cr 1.3-1.5) who is admitted following high risk stress test and hyperkalemia/aki.  His symptoms started several weeks ago. He still works 7 days a week. 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 He described this as a tingling feeling and some aching, which he though felt similar to bursitis (in his shoulder). Shortly thereafter he felt pain in his chest, starting about 5 minutes later. He described this as "indigestion" burning type pain. He was seen in the ED for this about 3 weeks ago and had a mildly elevated troponin but left prior to being seen d/t the long wait.  Over the last few weeks he has had a mild chest discomfort intermittently, usually while working hard. No LE edema, orthopnea, PND. No syncope or presyncope.   He underwent a stress test on 10/26 which showed significant ST depressions across precordium (~68mm) and inferior leads with associated chest pain and shortness of breath. Echo showed an anteroseptal WMA. He is now chest pain free but his EKG persisted to be abnormal for 10 minutes or more. He was scheduled for LHC, but had labs completed and showed a K of 6.3 and a cr of 1.8 so he was referred to the ED for hydration and likely cath. This morning he is doing well.   Review of systems complete and found to be negative unless listed above     Past Medical History:  Diagnosis Date   Anemia    Bursitis    Chronic kidney disease    STAGE 3   Diabetes mellitus without complication (HCC)    Hyperkalemia    Hypertension     Past Surgical History:  Procedure Laterality Date    EYE SURGERY Bilateral    CATARACT   HERNIA REPAIR Bilateral    INGUINAL   KNEE ARTHROSCOPY  1980's   XI ROBOTIC ASSISTED INGUINAL HERNIA REPAIR WITH MESH Left 07/21/2019   Procedure: XI ROBOTIC ASSISTED INGUINAL HERNIA REPAIR WITH MESH;  Surgeon: Carolan Shiver, MD;  Location: [facility] ORS;  Service: General;  Laterality: Left;    (Not in a hospital admission)  Social History   Socioeconomic History   Marital status: Widowed    Spouse name: Not on file   Number of children: Not on file   Years of education: Not on file   Highest education level: Not on file  Occupational History   Not on file  Tobacco Use   Smoking status: Never   Smokeless tobacco: Never  Vaping Use   Vaping Use: Never used  Substance and Sexual Activity   Alcohol use: Never   Drug use: Never   Sexual activity: Not on file  Other Topics Concern   Not on file  Social History Narrative   Not on file   Social Determinants of Health   Financial Resource Strain: Not on file  Food Insecurity: Not on file  Transportation Needs: Not on file  Physical Activity: Not on file  Stress: Not on file  Social Connections: Not on file  Intimate Partner Violence: Not on file    History reviewed. No pertinent family history.  Review of systems complete and found to be negative unless listed above      PHYSICAL EXAM  General: Well developed, well nourished, in no acute distress HEENT:  Normocephalic and atramatic Neck:  No JVD.  Lungs: Clear bilaterally to auscultation and percussion. Heart: HRRR . Normal S1 and S2 without gallops or murmurs.  Abdomen: Bowel sounds are positive, abdomen soft and non-tender  Msk:  Back normal, normal gait. Normal strength and tone for age. Extremities: No clubbing, cyanosis or edema.   Neuro: Alert and oriented X 3. Psych:  Good affect, responds appropriately  Labs:   Lab Results  Component Value Date   WBC 6.7 12/02/2020   HGB 12.7 (L) 12/02/2020   HCT 36.9 (L)  12/02/2020   MCV 91.1 12/02/2020   PLT 199 12/02/2020    Recent Labs  Lab 12/01/20 1657  NA 133*  K 5.3*  CL 107  CO2 21*  BUN 41*  CREATININE 1.74*  CALCIUM 8.7*  GLUCOSE 286*   No results found for: CKTOTAL, CKMB, CKMBINDEX, TROPONINI  Lab Results  Component Value Date   CHOL 81 12/02/2020   Lab Results  Component Value Date   HDL 33 (L) 12/02/2020   Lab Results  Component Value Date   LDLCALC 32 12/02/2020   Lab Results  Component Value Date   TRIG 82 12/02/2020   Lab Results  Component Value Date   CHOLHDL 2.5 12/02/2020   No results found for: LDLDIRECT    Radiology: DG Chest 2 View  Result Date: 12/01/2020 CLINICAL DATA:  Chest pain EXAM: CHEST - 2 VIEW COMPARISON:  11/09/2020 FINDINGS: The heart size and mediastinal contours are within normal limits. Both lungs are clear. Degenerative changes of the spine. IMPRESSION: No active cardiopulmonary disease. Electronically Signed   By: Jasmine Pang M.D.   On: 12/01/2020 17:38   DG Chest 2 View  Result Date: 11/09/2020 CLINICAL DATA:  Chest pain EXAM: CHEST - 2 VIEW COMPARISON:  None. FINDINGS: Normal mediastinum and cardiac silhouette. Normal pulmonary vasculature. No evidence of effusion, infiltrate, or pneumothorax. No acute bony abnormality. Degenerative osteophytosis of the spine. IMPRESSION: No acute cardiopulmonary process. Electronically Signed   By: Genevive Bi M.D.   On: 11/09/2020 11:02    EKG: NSR. Normal EKG  ASSESSMENT AND PLAN:  # Accelerating angina # High risk stress test Patient had an episode of left arm pain and chest discomfort while exerting himself on 11/09/2020. This resolved with resting over several minutes. He has had a few episodes of minor chest discomfort with exertion over the last few weeks. His risk factors for coronary disease include hypertension and type 2 diabetes. Underwent stress echo 10/26 which showed good exercise capacity (10.4 METS) with 4 mm ST depressions  anterior/lat/inferio with a SWMA of the anteroseptum. This is a high risk stress test. - Given AKI/CKD, he was hydrated overnight - Plan for [facility] today or tomrrow pending lab availability. Risks and benefits discussed with patient and he is agreeable to proceed.  -Continue aspirin 81 mg indefinitely -Continue atorvastatin 40 mg daily -Continue metoprolol XL 25 mg daily - SL nitro prescribed PRN -A1c 06/02/2020-7.4%  Signed: Armando Reichert MD 12/02/2020, 7:43 AM

## 2020-12-02 NOTE — ED Notes (Signed)
Pt ambulatory to bathroom with standby assist.

## 2020-12-03 ENCOUNTER — Encounter: Payer: Self-pay | Admitting: Cardiology

## 2020-12-03 DIAGNOSIS — I200 Unstable angina: Secondary | ICD-10-CM | POA: Diagnosis not present

## 2020-12-03 LAB — BASIC METABOLIC PANEL
Anion gap: 5 (ref 5–15)
Anion gap: 10 (ref 5–15)
BUN: 27 mg/dL — ABNORMAL HIGH (ref 8–23)
BUN: 23 mg/dL (ref 8–23)
CO2: 22 mmol/L (ref 22–32)
CO2: 21 mmol/L — ABNORMAL LOW (ref 22–32)
Calcium: 8.7 mg/dL — ABNORMAL LOW (ref 8.9–10.3)
Calcium: 8.6 mg/dL — ABNORMAL LOW (ref 8.9–10.3)
Chloride: 109 mmol/L (ref 98–111)
Chloride: 102 mmol/L (ref 98–111)
Creatinine, Ser: 1.65 mg/dL — ABNORMAL HIGH (ref 0.61–1.24)
Creatinine, Ser: 1.51 mg/dL — ABNORMAL HIGH (ref 0.61–1.24)
GFR, Estimated: 43 mL/min — ABNORMAL LOW (ref >60)
GFR, Estimated: 48 mL/min — ABNORMAL LOW (ref >60)
Glucose, Bld: 128 mg/dL — ABNORMAL HIGH (ref 70–99)
Glucose, Bld: 225 mg/dL — ABNORMAL HIGH (ref 70–99)
Potassium: 5.7 mmol/L — ABNORMAL HIGH (ref 3.5–5.1)
Potassium: 4.8 mmol/L (ref 3.5–5.1)
Sodium: 136 mmol/L (ref 135–145)
Sodium: 133 mmol/L — ABNORMAL LOW (ref 135–145)

## 2020-12-03 LAB — CBC
HCT: 36.8 % — ABNORMAL LOW (ref 39.0–52.0)
Hemoglobin: 12.4 g/dL — ABNORMAL LOW (ref 13.0–17.0)
MCH: 30.1 pg (ref 26.0–34.0)
MCHC: 33.7 g/dL (ref 30.0–36.0)
MCV: 89.3 fL (ref 80.0–100.0)
Platelets: 204 10*3/uL (ref 150–400)
RBC: 4.12 MIL/uL — ABNORMAL LOW (ref 4.22–5.81)
RDW: 13.2 % (ref 11.5–15.5)
WBC: 7.9 10*3/uL (ref 4.0–10.5)
nRBC: 0 % (ref 0.0–0.2)

## 2020-12-03 LAB — GLUCOSE, CAPILLARY
Glucose-Capillary: 118 mg/dL — ABNORMAL HIGH (ref 70–99)
Glucose-Capillary: 127 mg/dL — ABNORMAL HIGH (ref 70–99)
Glucose-Capillary: 142 mg/dL — ABNORMAL HIGH (ref 70–99)
Glucose-Capillary: 147 mg/dL — ABNORMAL HIGH (ref 70–99)
Glucose-Capillary: 155 mg/dL — ABNORMAL HIGH (ref 70–99)
Glucose-Capillary: 190 mg/dL — ABNORMAL HIGH (ref 70–99)

## 2020-12-03 LAB — PHOSPHORUS: Phosphorus: 4 mg/dL (ref 2.5–4.6)

## 2020-12-03 LAB — MAGNESIUM: Magnesium: 1.9 mg/dL (ref 1.7–2.4)

## 2020-12-03 MED ORDER — SODIUM CHLORIDE 0.9 % IV SOLN: INTRAVENOUS | Status: DC

## 2020-12-03 MED ORDER — HYDRALAZINE HCL 20 MG/ML IJ SOLN: 10.0000 mg | Freq: Four times a day (QID) | INTRAMUSCULAR | Status: DC | PRN

## 2020-12-03 MED ORDER — CYANOCOBALAMIN 1000 MCG/ML IJ SOLN
1000.0000 ug | Freq: Every day | INTRAMUSCULAR | Status: DC
  Administered 2020-12-03 – 2020-12-04 (×2): 1000 ug via INTRAMUSCULAR
  Filled 2020-12-03 (×2): qty 1

## 2020-12-03 MED ORDER — AMLODIPINE BESYLATE 5 MG PO TABS
5.0000 mg | ORAL_TABLET | Freq: Every day | ORAL | Status: DC
  Administered 2020-12-03 – 2020-12-04 (×2): 5 mg via ORAL
  Filled 2020-12-03 (×2): qty 1

## 2020-12-03 MED ORDER — SODIUM ZIRCONIUM CYCLOSILICATE 10 G PO PACK
10.0000 g | PACK | Freq: Three times a day (TID) | ORAL | Status: AC
  Administered 2020-12-03 (×2): 10 g via ORAL
  Filled 2020-12-03 (×2): qty 1

## 2020-12-03 MED ORDER — VITAMIN B-12 1000 MCG PO TABS: 500.0000 ug | ORAL_TABLET | Freq: Every day | ORAL | Status: DC

## 2020-12-03 MED ORDER — CLOPIDOGREL BISULFATE 75 MG PO TABS
75.0000 mg | ORAL_TABLET | Freq: Every day | ORAL | Status: DC
  Administered 2020-12-04: 75 mg via ORAL
  Filled 2020-12-03: qty 1

## 2020-12-03 MED ORDER — VITAMIN D (ERGOCALCIFEROL) 1.25 MG (50000 UNIT) PO CAPS
50000.0000 [IU] | ORAL_CAPSULE | ORAL | Status: DC
  Administered 2020-12-03: 50000 [IU] via ORAL
  Filled 2020-12-03: qty 1

## 2020-12-03 MED ORDER — SODIUM CHLORIDE 0.9 % IV SOLN: INTRAVENOUS | Status: AC

## 2020-12-03 MED ORDER — METOPROLOL SUCCINATE ER 25 MG PO TB24: 12.5000 mg | ORAL_TABLET | Freq: Every day | ORAL | Status: DC

## 2020-12-03 MED ORDER — METOPROLOL SUCCINATE ER 25 MG PO TB24
25.0000 mg | ORAL_TABLET | Freq: Every day | ORAL | Status: DC
  Administered 2020-12-03 – 2020-12-04 (×2): 25 mg via ORAL
  Filled 2020-12-03 (×2): qty 1

## 2020-12-03 MED ORDER — CLOPIDOGREL BISULFATE 75 MG PO TABS
300.0000 mg | ORAL_TABLET | Freq: Once | ORAL | Status: AC
  Administered 2020-12-03: 300 mg via ORAL
  Filled 2020-12-03: qty 4

## 2020-12-03 NOTE — Progress Notes (Signed)
[facility] Cardiology  CARDIOLOGY CONSULT NOTE  Patient ID: Thomas Rice MRN: [id number] DOB/AGE: [birth year] 75 y.o.  Admit date: 12/01/2020 Referring Physician Dileep Lucianne Muss Primary Physician Leanna Sato, MD Primary Cardiologist Armando Reichert, MD Reason for Consultation High risk stress, AKI/CKD, need for LHC  HPI:  [name] is a 75 y.o.male with a h/o HTN, T2DM, CKD (BL Cr 1.3-1.5) who is admitted following high risk stress test and hyperkalemia/aki.  His symptoms started several weeks ago. He still works 7 days a week. 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 He described this as a tingling feeling and some aching, which he though felt similar to bursitis (in his shoulder). Shortly thereafter he felt pain in his chest, starting about 5 minutes later. He described this as "indigestion" burning type pain. He was seen in the ED for this about 3 weeks ago and had a mildly elevated troponin but left prior to being seen d/t the long wait.  Over the last few weeks he has had a mild chest discomfort intermittently, usually while working hard. No LE edema, orthopnea, PND. No syncope or presyncope.   He underwent a stress test on 10/26 which showed significant ST depressions across precordium (~81mm) and inferior leads with associated chest pain and shortness of breath. Echo showed an anteroseptal WMA. He is now chest pain free but his EKG persisted to be abnormal for 10 minutes or more. He was scheduled for LHC, but had labs completed and showed a K of 6.3 and a cr of 1.8 so he was referred to the ED for hydration and likely cath. This morning he is doing well.   Interval History: - LHC yesterday evening showed 2 vessel CAD; 95% RCA stenosis and 90% mid LAD stentosis (diffuse disease distally), both heavily calcified.  -HE feels well this morning. No chest pain or shortness of breath.   Review of systems complete and found to be negative unless listed above     Past  Medical History:  Diagnosis Date   Anemia    Bursitis    Chronic kidney disease    STAGE 3   Diabetes mellitus without complication (HCC)    Hyperkalemia    Hypertension     Past Surgical History:  Procedure Laterality Date   EYE SURGERY Bilateral    CATARACT   HERNIA REPAIR Bilateral    INGUINAL   KNEE ARTHROSCOPY  1980's   LEFT HEART CATH AND CORONARY ANGIOGRAPHY N/A 12/02/2020   Procedure: LEFT HEART CATH AND CORONARY ANGIOGRAPHY;  Surgeon: Armando Reichert, MD;  Location: [facility] INVASIVE CV LAB;  Service: Cardiovascular;  Laterality: N/A;   XI ROBOTIC ASSISTED INGUINAL HERNIA REPAIR WITH MESH Left 07/21/2019   Procedure: XI ROBOTIC ASSISTED INGUINAL HERNIA REPAIR WITH MESH;  Surgeon: Carolan Shiver, MD;  Location: [facility] ORS;  Service: General;  Laterality: Left;    Medications Prior to Admission  Medication Sig Dispense Refill Last Dose   aspirin EC 81 MG tablet Take 81 mg by mouth at bedtime.      atorvastatin (LIPITOR) 40 MG tablet Take 40 mg by mouth daily.   12/01/2020 at Unknown   enalapril (VASOTEC) 20 MG tablet Take 20 mg by mouth daily.   12/01/2020 at Unknown   ergocalciferol (VITAMIN D2) 1.25 MG (50000 UT) capsule Take 50,000 Units by mouth every 30 (thirty) days.      glipiZIDE (GLUCOTROL XL) 5 MG 24 hr tablet Take 10 mg by mouth daily.  12/01/2020 at Unknown   latanoprost (XALATAN) 0.005 % ophthalmic solution Place 1 drop into both eyes at bedtime.   12/01/2020 at Unknown   linagliptin (TRADJENTA) 5 MG TABS tablet Take 5 mg by mouth daily.   12/01/2020 at Unknown   metoprolol succinate (TOPROL-XL) 25 MG 24 hr tablet Take 25 mg by mouth daily.   12/01/2020 at Unknown   nitroGLYCERIN (NITROSTAT) 0.4 MG SL tablet Place 0.4 mg under the tongue every 5 (five) minutes x 3 doses as needed for chest pain.   Unknown at PRN   Social History   Socioeconomic History   Marital status: Widowed    Spouse name: Not on file   Number of children: Not on file   Years of  education: Not on file   Highest education level: Not on file  Occupational History   Not on file  Tobacco Use   Smoking status: Never   Smokeless tobacco: Never  Vaping Use   Vaping Use: Never used  Substance and Sexual Activity   Alcohol use: Never   Drug use: Never   Sexual activity: Not on file  Other Topics Concern   Not on file  Social History Narrative   Not on file   Social Determinants of Health   Financial Resource Strain: Not on file  Food Insecurity: Not on file  Transportation Needs: Not on file  Physical Activity: Not on file  Stress: Not on file  Social Connections: Not on file  Intimate Partner Violence: Not on file    History reviewed. No pertinent family history.    Review of systems complete and found to be negative unless listed above    PHYSICAL EXAM  General: Well developed, well nourished, in no acute distress HEENT:  Normocephalic and atramatic Neck:  No JVD.  Lungs: Clear bilaterally to auscultation and percussion. Heart: HRRR . Normal S1 and S2 without gallops or murmurs.  Abdomen: Bowel sounds are positive, abdomen soft and non-tender  Msk:  Back normal, normal gait. Normal strength and tone for age. Extremities: No clubbing, cyanosis or edema.   Neuro: Alert and oriented X 3. Psych:  Good affect, responds appropriately  Labs:   Lab Results  Component Value Date   WBC 7.9 12/03/2020   HGB 12.4 (L) 12/03/2020   HCT 36.8 (L) 12/03/2020   MCV 89.3 12/03/2020   PLT 204 12/03/2020    Recent Labs  Lab 12/03/20 0552  NA 136  K 5.7*  CL 109  CO2 22  BUN 27*  CREATININE 1.65*  CALCIUM 8.7*  GLUCOSE 128*   No results found for: CKTOTAL, CKMB, CKMBINDEX, TROPONINI  Lab Results  Component Value Date   CHOL 81 12/02/2020   Lab Results  Component Value Date   HDL 33 (L) 12/02/2020   Lab Results  Component Value Date   LDLCALC 32 12/02/2020   Lab Results  Component Value Date   TRIG 82 12/02/2020   Lab Results   Component Value Date   CHOLHDL 2.5 12/02/2020   No results found for: LDLDIRECT    Radiology: DG Chest 2 View  Result Date: 12/01/2020 CLINICAL DATA:  Chest pain EXAM: CHEST - 2 VIEW COMPARISON:  11/09/2020 FINDINGS: The heart size and mediastinal contours are within normal limits. Both lungs are clear. Degenerative changes of the spine. IMPRESSION: No active cardiopulmonary disease. Electronically Signed   By: Jasmine Pang M.D.   On: 12/01/2020 17:38   DG Chest 2 View  Result Date: 11/09/2020 CLINICAL DATA:  Chest pain EXAM: CHEST - 2 VIEW COMPARISON:  None. FINDINGS: Normal mediastinum and cardiac silhouette. Normal pulmonary vasculature. No evidence of effusion, infiltrate, or pneumothorax. No acute bony abnormality. Degenerative osteophytosis of the spine. IMPRESSION: No acute cardiopulmonary process. Electronically Signed   By: Genevive Bi M.D.   On: 11/09/2020 11:02   CARDIAC CATHETERIZATION  Result Date: 12/02/2020   Prox RCA lesion is 95% stenosed.   RPDA-1 lesion is 50% stenosed.   RPDA-2 lesion is 70% stenosed.   Mid LAD-1 lesion is 90% stenosed.   Mid LAD-2 lesion is 70% stenosed.   Dist LAD lesion is 70% stenosed.   LV end diastolic pressure is normal.   There is no aortic valve stenosis. Conclusion: Severe heavily calcified coronary artery disease including 90% mid LAD stenosis and severe distal disease in LAD, 95% mid RCA, and diffuse disease in the PDA. Normal LVEDP Recommendations: Will discuss with the heart team regarding consideration for high risk PCI versus bypass.  Unfortunately his bypass target in his LAD is not great. Aspirin 81 mg indefinitely. Hydration overnight. Ongoing aggressive secondary prevention. If stable tomorrow likely will plan for discharge with outpatient revascularization.    EKG: NSR. Normal EKG  ASSESSMENT AND PLAN:  # Stable angina # High risk stress test Patient had an episode of left arm pain and chest discomfort while exerting himself on  11/09/2020. This resolved with resting over several minutes. He has had a few episodes of minor chest discomfort with exertion over the last few weeks. His risk factors for coronary disease include hypertension and type 2 diabetes. Underwent stress echo 10/26 which showed good exercise capacity (10.4 METS) with 4 mm ST depressions anterior/lat/inferio with a SWMA of the anteroseptum. This is a high risk stress test.LHC showed 95% calcified RCA, and 90% mid LAD (diffuse distal disease). LAD is not a good target for bypass. - After discussion with patient we will proceed with staged PCI to RCA and eventually LAD as an outpatient. Given renal function will need to use contrast sparing. We discussed bypass surgery as well, but targets are not ideal so patient is in favor of attempting PCI for revascularization.  -Continue aspirin 81 mg indefinitely - Plavix  300 mg given this morning. He will continue 75 mg daily therafter -Continue atorvastatin 40 mg daily -Resume metoprolol XL 25 mg daily. - Start amlodipine 5 mg - SL nitro prescribed PRN -A1c 06/02/2020-7.4% - From a cardiac perspective he is stable for discharge. He will follow up with me in clinic on 12/09/20 at 9:45 AM  # Hyperkalemia # Chronic kidney disease Baseline Cr probably 1.5-1.8. K 6.3 in clinic but improved here. Now K elevated again this morning.  - Stop enalapril - Will give 500 cc additional IVF this morning.  - Defer further management to internal medicine provider.   Signed: Armando Reichert MD 12/03/2020, 8:00 AM

## 2020-12-03 NOTE — Care Management Important Message (Signed)
Important Message  Patient Details  Name: Thomas Rice MRN: [id number] Date of Birth: Mar 26, 1945   Medicare Important Message Given:  N/A - LOS <3 / Initial given by admissions     Johnell Comings 12/03/2020, 9:19 AM

## 2020-12-03 NOTE — Progress Notes (Signed)
[facility] Progress Note  Patient: Thomas Rice    PJK:[id number]  DOA: 12/01/2020     Date of Service: the patient was seen and examined on 12/03/2020  Chief Complaint  Patient presents with   Abnormal Lab   Brief hospital course: Thomas Rice is a 75 y.o. male seen in ed with complaints of abnormal blood work. Chart reviewed with nursing note reviewed and ED MD note reviewed shows that patient has been having chest discomfort chest pain that has been going on for about a week and radiating to his left arm along with numbness and paresthesia.  Left arm pain specifically is also in the elbow.  He does report heavy lifting in and around the house patient went to his cardiologist and was referred here for abnormal labs and chest discomfort.   Patient otherwise denies any headaches blurred vision speech or gait issues, palpitation shortness of breath fevers chills flank pain abdominal pain skin or joint issue or urinary bladder or bowel issues.  Again review of system is negative for headaches blurred vision numbness shortness of breath chest palpitations edema cough abdominal pain nausea vomiting diarrhea fevers rashes or any other complaints.   Stress test at cards which was positive. Potassium was elevated. Dr. Beatrix Fetters -  cardiology sent pt to ed for abnormal stress test  with plan for Left heart cath in am.  Chart review does show patient has a scheduled left heart cath on November 3.   Pt has past medical history of anemia, CKD stage III, diabetes mellitus type 2, hypokalemia, hypertension.  Home medications include aspirin 81, Vasotec 20, vitamin D, glipizide 5, Xalatan eyedrops, Tradjenta 5.   Assessment and Plan: Principal Problem:   Unstable angina (HCC) Active Problems:   Hypertension   Diabetes mellitus type 2, uncomplicated (HCC)   CKD (chronic kidney disease), stage III (HCC)   Unstable angina: -Pt has risk factor for CAD due to age, diabetes and  dyslipidemia. -Continue aspirin 81 mg Po daily, loaded with Plavix 300 followed by 75 mg daily, Lipitor 40 mg daily, metoprolol 25 mg daily, started amlodipine 5 mg p.o. daily.  Continue nitroglycerin as needed for chest pain. Cardiology consulted, patient had stress echocardiogram followed by cardiac cath. Underwent stress echo 10/26 which showed good exercise capacity (10.4 METS) with 4 mm ST depressions anterior/lat/inferio with a SWMA of the anteroseptum. This is a high risk stress test. S/p LHC showed 95% calcified RCA, and 90% mid LAD (diffuse distal disease). LAD is not a good target for bypass. - After discussion with patient planned to proceed with staged PCI to RCA and eventually LAD as an outpatient. Given renal function will need to use contrast sparing. discussed bypass surgery as well, but targets are not ideal so patient is in favor of attempting PCI for revascularization.  -A1c 06/02/2020-7.4% - From a cardiac perspective he is stable for discharge. He will follow up with me in clinic on 12/09/20 at 9:45 AM   HTN: Blood pressure (!) 152/79, pulse 65, temperature (!) 97.5 F (36.4 C), temperature source Oral, resp. rate 18, height 5\' 9"  (1.753 m), weight 74.8 kg, SpO2 100 %. We will currently hold patient's home regimen of Vasotec and manage with hydralazine IV.   Diabetes mellitus type 2: Patient currently maintained on Glucotrol and Tradjenta. We will hold patient's medications and start patient on sliding scale and glycemic protocol and Accu-Cheks to prevent hypoglycemia.   Chronic kidney disease stage III: Recent Labs  Lab Results  Component Value Date    CREATININE 1.74 (H) 12/01/2020    CREATININE 1.72 (H) 11/09/2020    CREATININE 1.76 (H) 11/09/2020    S/p normal saline with sodium bicarb drip overnight to prevent worsening and CIN. Continue normal saline 75 mill per hour for overnight hydration  Hyperkalemia, potassium 5.7, patient was given Lokelma, repeat  potassium 4.8 Started low potassium diet  Body mass index is 24.37 kg/m.  Interventions:       Diet: Currently n.p.o., will resume diabetic diet once cleared by cardiology DVT Prophylaxis: Subcutaneous Heparin    Advance goals of care discussion: Full code  Family Communication: family was present at bedside, at the time of interview.  The pt provided permission to discuss medical plan with the family. Opportunity was given to ask question and all questions were answered satisfactorily.   Disposition:  Pt is from home, admitted with unstable angina, seen by cardiology, scheduled for cardiac cath today, which precludes a safe discharge. Discharge to home, when chest pain-free and cleared by cardiology.  Subjective: No significant overnight events, patient was admitted due to unstable angina, currently patient is chest pain-free.   Patient had hyperkalemia and renal failure so we will continue oral hydration, and titrate hyperkalemia, repeat labs tomorrow a.m. and then disposition plan.   Physical Exam: General:  alert oriented to time, place, and person.  Appear in no distress, affect appropriate Eyes: PERRLA ENT: Oral Mucosa Clear, moist  Neck: no JVD,  Cardiovascular: S1 and S2 Present, no Murmur,  Respiratory: good respiratory effort, Bilateral Air entry equal and Decreased, no Crackles, no wheezes Abdomen: Bowel Sound present, Soft and no tenderness,  Skin: no rashes Extremities: no Pedal edema, no calf tenderness Neurologic: without any new focal findings Gait not checked due to patient safety concerns  Vitals:   12/03/20 0146 12/03/20 0355 12/03/20 0843 12/03/20 1118  BP:  (!) 159/73 (!) 163/81 (!) 141/77  Pulse:  67 78 75  Resp:  20 16 16   Temp:  (!) 97.1 F (36.2 C) 97.9 F (36.6 C) 98.4 F (36.9 C)  TempSrc:      SpO2:  100% 99% 99%  Weight: 74.3 kg     Height:        Intake/Output Summary (Last 24 hours) at 12/03/2020 1550 Last data filed at 12/03/2020  1345 Gross per 24 hour  Intake 480 ml  Output 667 ml  Net -187 ml   Filed Weights   12/02/20 1529 12/02/20 2000 12/03/20 0146  Weight: 74.8 kg 80.2 kg 74.3 kg    Data Reviewed: I have personally reviewed and interpreted daily labs, tele strips, imagings as discussed above. I reviewed all nursing notes, pharmacy notes, vitals, pertinent old records I have discussed plan of care as described above with RN and patient/family.  CBC: Recent Labs  Lab 12/01/20 1657 12/02/20 0541 12/03/20 0552  WBC 9.0 6.7 7.9  HGB 12.2* 12.7* 12.4*  HCT 35.2* 36.9* 36.8*  MCV 89.6 91.1 89.3  PLT 211 199 204   Basic Metabolic Panel: Recent Labs  Lab 12/01/20 1657 12/02/20 0744 12/03/20 0552 12/03/20 1434  NA 133* 136 136 133*  K 5.3* 5.1 5.7* 4.8  CL 107 107 109 102  CO2 21* 22 22 21*  GLUCOSE 286* 165* 128* 225*  BUN 41* 32* 27* 23  CREATININE 1.74* 1.53* 1.65* 1.51*  CALCIUM 8.7* 8.7* 8.7* 8.6*  MG  --   --  1.9  --   PHOS  --   --  4.0  --     Studies: CARDIAC CATHETERIZATION  Result Date: 12/02/2020   Prox RCA lesion is 95% stenosed.   RPDA-1 lesion is 50% stenosed.   RPDA-2 lesion is 70% stenosed.   Mid LAD-1 lesion is 90% stenosed.   Mid LAD-2 lesion is 70% stenosed.   Dist LAD lesion is 70% stenosed.   LV end diastolic pressure is normal.   There is no aortic valve stenosis. Conclusion: Severe heavily calcified coronary artery disease including 90% mid LAD stenosis and severe distal disease in LAD, 95% mid RCA, and diffuse disease in the PDA. Normal LVEDP Recommendations: Will discuss with the heart team regarding consideration for high risk PCI versus bypass.  Unfortunately his bypass target in his LAD is not great. Aspirin 81 mg indefinitely. Hydration overnight. Ongoing aggressive secondary prevention. If stable tomorrow likely will plan for discharge with outpatient revascularization.    Scheduled Meds:  amLODipine  5 mg Oral Daily   aspirin EC  81 mg Oral QHS   [START ON  12/04/2020] clopidogrel  75 mg Oral Daily   cyanocobalamin  1,000 mcg Intramuscular Daily   Followed by   Melene Muller ON 12/06/2020] vitamin B-12  500 mcg Oral Daily   heparin  5,000 Units Subcutaneous Q8H   insulin aspart  0-9 Units Subcutaneous TID WC   metoprolol succinate  25 mg Oral Daily   sodium chloride flush  3 mL Intravenous Q12H   sodium chloride flush  3 mL Intravenous Q12H   sodium zirconium cyclosilicate  10 g Oral TID   Vitamin D (Ergocalciferol)  50,000 Units Oral Q7 days   Continuous Infusions:  sodium chloride     sodium chloride     PRN Meds: sodium chloride, acetaminophen, hydrALAZINE, morphine injection, nitroGLYCERIN, ondansetron (ZOFRAN) IV, sodium chloride flush  Time spent: 35 minutes  Author: Gillis Santa. MD [facility] 12/03/2020 3:50 PM  To reach On-call, see care teams to locate the attending and reach out to them via [email or web address]. If 7PM-7AM, please contact night-coverage If you still have difficulty reaching the attending provider, please page the [facility] (Director on Call) for [facility] on amion for assistance.

## 2020-12-04 DIAGNOSIS — I200 Unstable angina: Secondary | ICD-10-CM | POA: Diagnosis not present

## 2020-12-04 LAB — BASIC METABOLIC PANEL
Anion gap: 7 (ref 5–15)
BUN: 23 mg/dL (ref 8–23)
CO2: 22 mmol/L (ref 22–32)
Calcium: 8.7 mg/dL — ABNORMAL LOW (ref 8.9–10.3)
Chloride: 105 mmol/L (ref 98–111)
Creatinine, Ser: 1.45 mg/dL — ABNORMAL HIGH (ref 0.61–1.24)
GFR, Estimated: 50 mL/min — ABNORMAL LOW (ref >60)
Glucose, Bld: 144 mg/dL — ABNORMAL HIGH (ref 70–99)
Potassium: 4.6 mmol/L (ref 3.5–5.1)
Sodium: 134 mmol/L — ABNORMAL LOW (ref 135–145)

## 2020-12-04 LAB — CBC
HCT: 35.7 % — ABNORMAL LOW (ref 39.0–52.0)
Hemoglobin: 12.4 g/dL — ABNORMAL LOW (ref 13.0–17.0)
MCH: 31.4 pg (ref 26.0–34.0)
MCHC: 34.7 g/dL (ref 30.0–36.0)
MCV: 90.4 fL (ref 80.0–100.0)
Platelets: 190 10*3/uL (ref 150–400)
RBC: 3.95 MIL/uL — ABNORMAL LOW (ref 4.22–5.81)
RDW: 13.2 % (ref 11.5–15.5)
WBC: 7.2 10*3/uL (ref 4.0–10.5)
nRBC: 0 % (ref 0.0–0.2)

## 2020-12-04 LAB — PHOSPHORUS: Phosphorus: 3.4 mg/dL (ref 2.5–4.6)

## 2020-12-04 LAB — GLUCOSE, CAPILLARY
Glucose-Capillary: 143 mg/dL — ABNORMAL HIGH (ref 70–99)
Glucose-Capillary: 181 mg/dL — ABNORMAL HIGH (ref 70–99)

## 2020-12-04 LAB — MAGNESIUM: Magnesium: 1.9 mg/dL (ref 1.7–2.4)

## 2020-12-04 MED ORDER — CLOPIDOGREL BISULFATE 75 MG PO TABS: 75.0000 mg | ORAL_TABLET | Freq: Every day | ORAL | 2 refills | Status: AC

## 2020-12-04 MED ORDER — CYANOCOBALAMIN 500 MCG PO TABS: 500.0000 ug | ORAL_TABLET | Freq: Every day | ORAL | 2 refills | Status: AC

## 2020-12-04 MED ORDER — AMLODIPINE BESYLATE 5 MG PO TABS: 5.0000 mg | ORAL_TABLET | Freq: Every day | ORAL | 0 refills | Status: AC

## 2020-12-04 NOTE — Discharge Summary (Signed)
[facility] Discharge Summary   Patient: Thomas Rice:[id number]  PCP: Leanna Sato, MD  Date of admission: 12/01/2020   Date of discharge:  12/04/2020     Discharge Diagnoses:  Principal Problem:   Unstable angina [facility]) Active Problems:   Diabetes mellitus type 2, uncomplicated (HCC)   Hypertension   CKD (chronic kidney disease), stage III (HCC)   Admitted From: Home Disposition:  Home   Recommendations for Outpatient Follow-up:  Follow-up with PCP in 1 week Follow-up with cardiology next week, appointment already made on 12/09/2020 Follow up LABS/TEST:     Follow-up Information     Armando Reichert, MD Follow up in 6 day(s).   Specialty: Cardiology Contact information: [address] [place] [address] [phone]                Diet recommendation: Cardiac diet  Activity: The patient is advised to gradually reintroduce usual activities, as tolerated  Discharge Condition: stable  Code Status: Full code   History of present illness: As per the H and P dictated on admission Hospital Course:  Thomas Rice is a 75 y.o. male seen in ed with complaints of abnormal blood work. Chart reviewed with nursing note reviewed and ED MD note reviewed shows that patient has been having chest discomfort chest pain that has been going on for about a week and radiating to his left arm along with numbness and paresthesia.  Left arm pain specifically is also in the elbow.  He does report heavy lifting in and around the house patient went to his cardiologist and was referred here for abnormal labs and chest discomfort.  Patient otherwise denies any headaches blurred vision speech or gait issues, palpitation shortness of breath fevers chills flank pain abdominal pain skin or joint issue or urinary bladder or bowel issues.  Again review of system is negative for headaches blurred vision numbness shortness of breath chest palpitations edema cough abdominal  pain nausea vomiting diarrhea fevers rashes or any other complaints.  Stress test at cards which was positive. Potassium was elevated. Dr. Beatrix Fetters -  cardiology sent pt to ed for abnormal stress test  with plan for Left heart cath in am.  Chart review does show patient has a scheduled left heart cath on November 3.   Pt has past medical history of anemia, CKD stage III, diabetes mellitus type 2, hypokalemia, hypertension.  Home medications include aspirin 81, Vasotec 20, vitamin D, glipizide 5, Xalatan eyedrops, Tradjenta 5.  Assessment and Plan: Principal Problem:   Unstable angina (HCC) Active Problems:   Hypertension   Diabetes mellitus type 2, uncomplicated (HCC)   CKD (chronic kidney disease), stage III (HCC)  # Unstable angina: Pt has risk factor for CAD due to age, diabetes and dyslipidemia. Continue aspirin 81 mg Po daily, loaded with Plavix 300 followed by 75 mg daily, Lipitor 40 mg daily, metoprolol 25 mg daily, started amlodipine 5 mg p.o. daily. S/p nitroglycerin prn for chest pain. Cardiology consulted, patient had stress echocardiogram followed by cardiac cath. Underwent stress echo 10/26 which showed good exercise capacity (10.4 METS) with 4 mm ST depressions anterior/lat/inferio with a SWMA of the anteroseptum. This is a high risk stress test. S/p LHC showed 95% calcified RCA, and 90% mid LAD (diffuse distal disease). LAD is not a good target for bypass.  After discussion with patient planned to proceed with staged PCI to RCA and eventually LAD as an outpatient. Given renal function will need to  use contrast sparing. discussed bypass surgery as well, but targets are not ideal so patient is in favor of attempting PCI for revascularization.  A1c 06/02/2020-7.4% From a cardiac perspective he is stable for discharge. He will follow up with me in clinic on 12/09/20 at 9:45 AM # HTN, BP was slightly elevated, started amlodipine 5 mg daily and resumed home medications.  Patient was advised  to monitor BP at home and follow with PCP for further management. # Diabetes mellitus type 2, patient currently maintained on Glucotrol and Tradjenta.  Resumed home medications on discharge, during hospital stay patient was on sliding scale.  Patient was advised to continue diabetic diet and follow with PCP for further management. # Chronic kidney disease stage III, S/p normal saline with sodium bicarb drip overnight to prevent worsening and CIN.  Patient received IV fluid for hydration, creatinine level improved.  Advised to continue oral hydration at home and follow with PCP and nephrology as an outpatient.   # Hyperkalemia, potassium 5.7, patient was given Lokelma, repeat potassium 4.8. continue low potassium diet. Vitamin B12 level 238, target >400, started oral supplement.  Follow with PCP and repeat B12 level after 3 months. Vitamin D insufficiency, started vitamin D 53 units p.o. weekly.  Repeat vitamin D level after 3 months and follow with PCP.  Body mass index is 24.04 kg/m.  Nutrition Interventions:  Patient was ambulatory without any assistance. On the day of the discharge the patient's vitals were stable, and no other acute medical condition were reported by patient. the patient was felt safe to be discharge at Home.  Consultants: Cardiology Procedures: Cardiac Cath  Discharge Exam: General: Appear in no distress, no Rash; Oral Mucosa Clear, moist. Cardiovascular: S1 and S2 Present, no Murmur, Respiratory: normal respiratory effort, Bilateral Air entry present and no Crackles, no wheezes Abdomen: Bowel Sound present, Soft and no tenderness, no hernia Extremities: no Pedal edema, no calf tenderness Neurology: alert and oriented to time, place, and person affect appropriate.  Filed Weights   12/02/20 2000 12/03/20 0146 12/04/20 0619  Weight: 80.2 kg 74.3 kg 73.8 kg   Vitals:   12/04/20 0733 12/04/20 1138  BP: (!) 144/77 136/68  Pulse: 67 65  Resp: 16 17  Temp: 97.9 F  (36.6 C)   SpO2: 100% 98%    DISCHARGE MEDICATION: Allergies as of 12/04/2020   No Known Allergies      Medication List     TAKE these medications    amLODipine 5 MG tablet Commonly known as: NORVASC Take 1 tablet (5 mg total) by mouth daily. Start taking on: December 05, 2020   aspirin EC 81 MG tablet Take 81 mg by mouth at bedtime.   atorvastatin 40 MG tablet Commonly known as: LIPITOR Take 40 mg by mouth daily.   clopidogrel 75 MG tablet Commonly known as: PLAVIX Take 1 tablet (75 mg total) by mouth daily. Start taking on: December 05, 2020   ergocalciferol 1.25 MG (50000 UT) capsule Commonly known as: VITAMIN D2 Take 50,000 Units by mouth every 30 (thirty) days.   glipiZIDE 5 MG 24 hr tablet Commonly known as: GLUCOTROL XL Take 10 mg by mouth daily.   latanoprost 0.005 % ophthalmic solution Commonly known as: XALATAN Place 1 drop into both eyes at bedtime.   linagliptin 5 MG Tabs tablet Commonly known as: TRADJENTA Take 5 mg by mouth daily.   metoprolol succinate 25 MG 24 hr tablet Commonly known as: TOPROL-XL Take 25 mg by mouth daily.  nitroGLYCERIN 0.4 MG SL tablet Commonly known as: NITROSTAT Place 0.4 mg under the tongue every 5 (five) minutes x 3 doses as needed for chest pain.   vitamin B-12 500 MCG tablet Commonly known as: CYANOCOBALAMIN Take 1 tablet (500 mcg total) by mouth daily. Start taking on: December 06, 2020       No Known Allergies Discharge Instructions     Call MD for:  difficulty breathing, headache or visual disturbances   Complete by: As directed    Call MD for:  extreme fatigue   Complete by: As directed    Call MD for:  persistant dizziness or light-headedness   Complete by: As directed    Call MD for:  persistant nausea and vomiting   Complete by: As directed    Call MD for:  severe uncontrolled pain   Complete by: As directed    Call MD for:  temperature >100.4   Complete by: As directed    Diet - low sodium  heart healthy   Complete by: As directed    Discharge instructions   Complete by: As directed    Follow-up with PCP in 1 week Follow-up with cardiology next week, appointment already made on 12/09/2020   Increase activity slowly   Complete by: As directed        The results of significant diagnostics from this hospitalization (including imaging, microbiology, ancillary and laboratory) are listed below for reference.    Significant Diagnostic Studies: DG Chest 2 View  Result Date: 12/01/2020 CLINICAL DATA:  Chest pain EXAM: CHEST - 2 VIEW COMPARISON:  11/09/2020 FINDINGS: The heart size and mediastinal contours are within normal limits. Both lungs are clear. Degenerative changes of the spine. IMPRESSION: No active cardiopulmonary disease. Electronically Signed   By: Jasmine Pang M.D.   On: 12/01/2020 17:38   DG Chest 2 View  Result Date: 11/09/2020 CLINICAL DATA:  Chest pain EXAM: CHEST - 2 VIEW COMPARISON:  None. FINDINGS: Normal mediastinum and cardiac silhouette. Normal pulmonary vasculature. No evidence of effusion, infiltrate, or pneumothorax. No acute bony abnormality. Degenerative osteophytosis of the spine. IMPRESSION: No acute cardiopulmonary process. Electronically Signed   By: Genevive Bi M.D.   On: 11/09/2020 11:02   CARDIAC CATHETERIZATION  Result Date: 12/02/2020   Prox RCA lesion is 95% stenosed.   RPDA-1 lesion is 50% stenosed.   RPDA-2 lesion is 70% stenosed.   Mid LAD-1 lesion is 90% stenosed.   Mid LAD-2 lesion is 70% stenosed.   Dist LAD lesion is 70% stenosed.   LV end diastolic pressure is normal.   There is no aortic valve stenosis. Conclusion: Severe heavily calcified coronary artery disease including 90% mid LAD stenosis and severe distal disease in LAD, 95% mid RCA, and diffuse disease in the PDA. Normal LVEDP Recommendations: Will discuss with the heart team regarding consideration for high risk PCI versus bypass.  Unfortunately his bypass target in his LAD  is not great. Aspirin 81 mg indefinitely. Hydration overnight. Ongoing aggressive secondary prevention. If stable tomorrow likely will plan for discharge with outpatient revascularization.    Microbiology: Recent Results (from the past 240 hour(s))  Resp Panel by RT-PCR (Flu A&B, Covid) Nasopharyngeal Swab     Status: None   Collection Time: 12/01/20  9:52 PM   Specimen: Nasopharyngeal Swab; Nasopharyngeal(NP) swabs in vial transport medium  Result Value Ref Range Status   SARS Coronavirus 2 by RT PCR NEGATIVE NEGATIVE Final    Comment: (NOTE) SARS-CoV-2 target nucleic acids are  NOT DETECTED.  The SARS-CoV-2 RNA is generally detectable in upper respiratory specimens during the acute phase of infection. The lowest concentration of SARS-CoV-2 viral copies this assay can detect is 138 copies/mL. A negative result does not preclude SARS-Cov-2 infection and should not be used as the sole basis for treatment or other patient management decisions. A negative result may occur with  improper specimen collection/handling, submission of specimen other than nasopharyngeal swab, presence of viral mutation(s) within the areas targeted by this assay, and inadequate number of viral copies(<138 copies/mL). A negative result must be combined with clinical observations, patient history, and epidemiological information. The expected result is Negative.  Fact Sheet for Patients:  BloggerCourse.com  Fact Sheet for Healthcare Providers:  SeriousBroker.it  This test is no t yet approved or cleared by the [place] FDA and  has been authorized for detection and/or diagnosis of SARS-CoV-2 by FDA under an Emergency Use Authorization (EUA). This EUA will remain  in effect (meaning this test can be used) for the duration of the COVID-19 declaration under Section 564(b)(1) of the Act, 21 U.S.C.section 360bbb-3(b)(1), unless the authorization is terminated   or revoked sooner.       Influenza A by PCR NEGATIVE NEGATIVE Final   Influenza B by PCR NEGATIVE NEGATIVE Final    Comment: (NOTE) The Xpert Xpress SARS-CoV-2/FLU/RSV plus assay is intended as an aid in the diagnosis of influenza from Nasopharyngeal swab specimens and should not be used as a sole basis for treatment. Nasal washings and aspirates are unacceptable for Xpert Xpress SARS-CoV-2/FLU/RSV testing.  Fact Sheet for Patients: BloggerCourse.com  Fact Sheet for Healthcare Providers: SeriousBroker.it  This test is not yet approved or cleared by the [place] FDA and has been authorized for detection and/or diagnosis of SARS-CoV-2 by FDA under an Emergency Use Authorization (EUA). This EUA will remain in effect (meaning this test can be used) for the duration of the COVID-19 declaration under Section 564(b)(1) of the Act, 21 U.S.C. section 360bbb-3(b)(1), unless the authorization is terminated or revoked.  Performed at [facility], [address] [place], [place] [address]      Labs: CBC: Recent Labs  Lab 12/01/20 1657 12/02/20 0541 12/03/20 0552 12/04/20 0559  WBC 9.0 6.7 7.9 7.2  HGB 12.2* 12.7* 12.4* 12.4*  HCT 35.2* 36.9* 36.8* 35.7*  MCV 89.6 91.1 89.3 90.4  PLT 211 199 204 190   Basic Metabolic Panel: Recent Labs  Lab 12/01/20 1657 12/02/20 0744 12/03/20 0552 12/03/20 1434 12/04/20 0559  NA 133* 136 136 133* 134*  K 5.3* 5.1 5.7* 4.8 4.6  CL 107 107 109 102 105  CO2 21* 22 22 21* 22  GLUCOSE 286* 165* 128* 225* 144*  BUN 41* 32* 27* 23 23  CREATININE 1.74* 1.53* 1.65* 1.51* 1.45*  CALCIUM 8.7* 8.7* 8.7* 8.6* 8.7*  MG  --   --  1.9  --  1.9  PHOS  --   --  4.0  --  3.4   Liver Function Tests: No results for input(s): AST, ALT, ALKPHOS, BILITOT, PROT, ALBUMIN in the last 168 hours. No results for input(s): LIPASE, AMYLASE in the last 168 hours. No results for input(s): AMMONIA  in the last 168 hours. Cardiac Enzymes: No results for input(s): CKTOTAL, CKMB, CKMBINDEX, TROPONINI in the last 168 hours. BNP (last 3 results) No results for input(s): BNP in the last 8760 hours. CBG: Recent Labs  Lab 12/03/20 1118 12/03/20 1735 12/03/20 2040 12/04/20 0733 12/04/20 1206  GLUCAP  190* 147* 142* 143* 181*    Time spent: 35 minutes  Signed:  Gillis Santa  [facility]  12/04/2020 12:31 PM

## 2020-12-04 NOTE — Progress Notes (Signed)
Discharge instructions explained to pt/ verbalized an understanding/ ivs and tele removed/ will transport off unit via wheelchair  ?

## 2020-12-09 ENCOUNTER — Ambulatory Visit: Admit: 2020-12-09 | Payer: Medicare HMO | Admitting: Cardiology

## 2020-12-09 SURGERY — LEFT HEART CATH AND CORONARY ANGIOGRAPHY
Anesthesia: Moderate Sedation | Laterality: Left

## 2021-01-03 ENCOUNTER — Other Ambulatory Visit: Payer: Self-pay

## 2021-01-03 ENCOUNTER — Encounter: Payer: Medicare HMO | Attending: Cardiology

## 2021-01-03 DIAGNOSIS — Z955 Presence of coronary angioplasty implant and graft: Secondary | ICD-10-CM

## 2021-01-03 NOTE — Progress Notes (Signed)
Virtual Visit completed. Patient informed on EP and RD appointment and 6 Minute walk test. Patient also informed of patient health questionnaires on My Chart. Patient Verbalizes understanding. Visit diagnosis can be found in [facility] 12/09/2020. 

## 2021-01-13 ENCOUNTER — Encounter: Payer: Medicare HMO | Attending: Cardiology | Admitting: *Deleted

## 2021-01-13 ENCOUNTER — Other Ambulatory Visit: Payer: Self-pay

## 2021-01-13 VITALS — Ht 68.4 in | Wt 172.3 lb

## 2021-01-13 DIAGNOSIS — Z955 Presence of coronary angioplasty implant and graft: Secondary | ICD-10-CM | POA: Insufficient documentation

## 2021-01-13 DIAGNOSIS — Z79899 Other long term (current) drug therapy: Secondary | ICD-10-CM | POA: Diagnosis not present

## 2021-01-13 NOTE — Progress Notes (Signed)
Cardiac Individual Treatment Plan  Patient Details  Name: Thomas Rice MRN: [id number] Date of Birth: 12-23-1945 Referring Provider:   Flowsheet Row Cardiac Rehab from 01/13/2021 in [facility] Cardiac and Pulmonary Rehab  Referring Provider Sena Slate MD       Initial Encounter Date:  Flowsheet Row Cardiac Rehab from 01/13/2021 in [facility] Cardiac and Pulmonary Rehab  Date 01/13/21       Visit Diagnosis: Status post coronary artery stent placement  Patient's Home Medications on Admission:  Current Outpatient Medications:    amLODipine (NORVASC) 5 MG tablet, Take 1 tablet (5 mg total) by mouth daily., Disp: 30 tablet, Rfl: 0   aspirin EC 81 MG tablet, Take 81 mg by mouth at bedtime., Disp: , Rfl:    atorvastatin (LIPITOR) 40 MG tablet, Take 40 mg by mouth daily., Disp: , Rfl:    clopidogrel (PLAVIX) 75 MG tablet, Take 1 tablet (75 mg total) by mouth daily., Disp: 30 tablet, Rfl: 2   ergocalciferol (VITAMIN D2) 1.25 MG (50000 UT) capsule, Take 50,000 Units by mouth every 30 (thirty) days., Disp: , Rfl:    glipiZIDE (GLUCOTROL XL) 5 MG 24 hr tablet, Take 10 mg by mouth daily., Disp: , Rfl:    latanoprost (XALATAN) 0.005 % ophthalmic solution, Place 1 drop into both eyes at bedtime., Disp: , Rfl:    linagliptin (TRADJENTA) 5 MG TABS tablet, Take 5 mg by mouth daily., Disp: , Rfl:    linagliptin (TRADJENTA) 5 MG TABS tablet, Take 1 tablet by mouth daily. (Patient not taking: Reported on 01/03/2021), Disp: , Rfl:    metoprolol succinate (TOPROL-XL) 25 MG 24 hr tablet, Take 25 mg by mouth daily., Disp: , Rfl:    nitroGLYCERIN (NITROSTAT) 0.4 MG SL tablet, Place 0.4 mg under the tongue every 5 (five) minutes x 3 doses as needed for chest pain., Disp: , Rfl:    triamcinolone cream (KENALOG) 0.1 %, Apply topically 2 (two) times daily., Disp: , Rfl:    vitamin B-12 (CYANOCOBALAMIN) 500 MCG tablet, Take 1 tablet (500 mcg total) by mouth daily., Disp: 30 tablet, Rfl: 2  Past Medical History: Past  Medical History:  Diagnosis Date   Anemia    Bursitis    Chronic kidney disease    STAGE 3   Diabetes mellitus without complication (HCC)    Hyperkalemia    Hypertension     Tobacco Use: Social History   Tobacco Use  Smoking Status Never  Smokeless Tobacco Never    Labs: Recent Review Flowsheet Data     Labs for ITP Cardiac and Pulmonary Rehab Latest Ref Rng & Units 12/01/2020 12/02/2020   Cholestrol 0 - 200 mg/dL - 81   LDLCALC 0 - 99 mg/dL - 32   HDL >10 mg/dL - 07(H)   Trlycerides <219 mg/dL - 82   Hemoglobin X5O 4.8 - 5.6 % 7.6(H) -        Exercise Target Goals: Exercise Program Goal: Individual exercise prescription set using results from initial 6 min walk test and THRR while considering  patient's activity barriers and safety.   Exercise Prescription Goal: Initial exercise prescription builds to 30-45 minutes a day of aerobic activity, 2-3 days per week.  Home exercise guidelines will be given to patient during program as part of exercise prescription that the participant will acknowledge.   Education: Aerobic Exercise: - Group verbal and visual presentation on the components of exercise prescription. Introduces F.I.T.T principle from ACSM for exercise prescriptions.  Reviews F.I.T.T. principles of  aerobic exercise including progression. Written material given at graduation. Flowsheet Row Cardiac Rehab from 01/13/2021 in [facility] Cardiac and Pulmonary Rehab  Education need identified 01/13/21       Education: Resistance Exercise: - Group verbal and visual presentation on the components of exercise prescription. Introduces F.I.T.T principle from ACSM for exercise prescriptions  Reviews F.I.T.T. principles of resistance exercise including progression. Written material given at graduation.    Education: Exercise & Equipment Safety: - Individual verbal instruction and demonstration of equipment use and safety with use of the equipment. Flowsheet Row Cardiac Rehab  from 01/13/2021 in [facility] Cardiac and Pulmonary Rehab  Date 01/03/21  Educator jh  Instruction Review Code 1- Verbalizes Understanding       Education: Exercise Physiology & General Exercise Guidelines: - Group verbal and written instruction with models to review the exercise physiology of the cardiovascular system and associated critical values. Provides general exercise guidelines with specific guidelines to those with heart or lung disease.    Education: Flexibility, Balance, Mind/Body Relaxation: - Group verbal and visual presentation with interactive activity on the components of exercise prescription. Introduces F.I.T.T principle from ACSM for exercise prescriptions. Reviews F.I.T.T. principles of flexibility and balance exercise training including progression. Also discusses the mind body connection.  Reviews various relaxation techniques to help reduce and manage stress (i.e. Deep breathing, progressive muscle relaxation, and visualization). Balance handout provided to take home. Written material given at graduation.   Activity Barriers & Risk Stratification:  Activity Barriers & Cardiac Risk Stratification - 01/13/21 1026       Activity Barriers & Cardiac Risk Stratification   Activity Barriers Muscular Weakness;Deconditioning;Other (comment);Joint Problems    Comments left shoulder limited ROM    Cardiac Risk Stratification Moderate             6 Minute Walk:  6 Minute Walk     Row Name 01/13/21 1025         6 Minute Walk   Phase Initial     Distance 1290 feet     Walk Time 6 minutes     # of Rest Breaks 0     MPH 2.44     METS 3.12     RPE 9     VO2 Peak 10.91     Symptoms No     Resting HR 69 bpm     Resting BP 146/60     Resting Oxygen Saturation  99 %     Exercise Oxygen Saturation  during 6 min walk 98 %     Max Ex. HR 109 bpm     Max Ex. BP 168/74     2 Minute Post BP 136/70              Oxygen Initial Assessment:   Oxygen  Re-Evaluation:   Oxygen Discharge (Final Oxygen Re-Evaluation):   Initial Exercise Prescription:  Initial Exercise Prescription - 01/13/21 1000       Date of Initial Exercise RX and Referring Provider   Date 01/13/21    Referring Provider Donnelly Angelica MD      Oxygen   Maintain Oxygen Saturation 88% or higher      Treadmill   MPH 2.4    Grade 1    Minutes 15    METs 3.17      NuStep   Level 3    SPM 80    Minutes 15    METs 3      Recumbant Elliptical   Level 1  RPM 50    Minutes 15    METs 2      Prescription Details   Frequency (times per week) 3    Duration Progress to 30 minutes of continuous aerobic without signs/symptoms of physical distress      Intensity   THRR 40-80% of Max Heartrate 99-130    Ratings of Perceived Exertion 11-13    Perceived Dyspnea 0-4      Progression   Progression Continue to progress workloads to maintain intensity without signs/symptoms of physical distress.      Resistance Training   Training Prescription Yes    Weight 4 lb    Reps 10-15             Perform Capillary Blood Glucose checks as needed.  Exercise Prescription Changes:   Exercise Prescription Changes     Row Name 01/13/21 1000             Response to Exercise   Blood Pressure (Admit) 146/60       Blood Pressure (Exercise) 168/74       Blood Pressure (Exit) 136/70       Heart Rate (Admit) 68 bpm       Heart Rate (Exercise) 109 bpm       Heart Rate (Exit) 77 bpm       Oxygen Saturation (Admit) 99 %       Oxygen Saturation (Exercise) 98 %       Rating of Perceived Exertion (Exercise) 9       Symptoms none       Comments walk test results                Exercise Comments:   Exercise Goals and Review:   Exercise Goals     Row Name 01/13/21 1027             Exercise Goals   Increase Physical Activity Yes       Intervention Develop an individualized exercise prescription for aerobic and resistive training based on initial  evaluation findings, risk stratification, comorbidities and participant's personal goals.;Provide advice, education, support and counseling about physical activity/exercise needs.       Expected Outcomes Short Term: Attend rehab on a regular basis to increase amount of physical activity.;Long Term: Add in home exercise to make exercise part of routine and to increase amount of physical activity.;Long Term: Exercising regularly at least 3-5 days a week.       Increase Strength and Stamina Yes       Intervention Provide advice, education, support and counseling about physical activity/exercise needs.;Develop an individualized exercise prescription for aerobic and resistive training based on initial evaluation findings, risk stratification, comorbidities and participant's personal goals.       Expected Outcomes Short Term: Increase workloads from initial exercise prescription for resistance, speed, and METs.;Short Term: Perform resistance training exercises routinely during rehab and add in resistance training at home;Long Term: Improve cardiorespiratory fitness, muscular endurance and strength as measured by increased METs and functional capacity ( )       Able to understand and use rate of perceived exertion (RPE) scale Yes       Intervention Provide education and explanation on how to use RPE scale       Expected Outcomes Long Term:  Able to use RPE to guide intensity level when exercising independently;Short Term: Able to use RPE daily in rehab to express subjective intensity level       Able to understand  and use Dyspnea scale Yes       Intervention Provide education and explanation on how to use Dyspnea scale       Expected Outcomes Short Term: Able to use Dyspnea scale daily in rehab to express subjective sense of shortness of breath during exertion;Long Term: Able to use Dyspnea scale to guide intensity level when exercising independently       Knowledge and understanding of Target Heart Rate  Range (THRR) Yes       Intervention Provide education and explanation of THRR including how the numbers were predicted and where they are located for reference       Expected Outcomes Short Term: Able to state/look up THRR;Short Term: Able to use daily as guideline for intensity in rehab;Long Term: Able to use THRR to govern intensity when exercising independently       Able to check pulse independently Yes       Intervention Provide education and demonstration on how to check pulse in carotid and radial arteries.;Review the importance of being able to check your own pulse for safety during independent exercise       Expected Outcomes Short Term: Able to explain why pulse checking is important during independent exercise;Long Term: Able to check pulse independently and accurately       Understanding of Exercise Prescription Yes       Intervention Provide education, explanation, and written materials on patient's individual exercise prescription       Expected Outcomes Short Term: Able to explain program exercise prescription;Long Term: Able to explain home exercise prescription to exercise independently                Exercise Goals Re-Evaluation :   Discharge Exercise Prescription (Final Exercise Prescription Changes):  Exercise Prescription Changes - 01/13/21 1000       Response to Exercise   Blood Pressure (Admit) 146/60    Blood Pressure (Exercise) 168/74    Blood Pressure (Exit) 136/70    Heart Rate (Admit) 68 bpm    Heart Rate (Exercise) 109 bpm    Heart Rate (Exit) 77 bpm    Oxygen Saturation (Admit) 99 %    Oxygen Saturation (Exercise) 98 %    Rating of Perceived Exertion (Exercise) 9    Symptoms none    Comments walk test results             Nutrition:  Target Goals: Understanding of nutrition guidelines, daily intake of sodium 1500mg , cholesterol 200mg , calories 30% from fat and 7% or less from saturated fats, daily to have 5 or more servings of fruits and  vegetables.  Education: All About Nutrition: -Group instruction provided by verbal, written material, interactive activities, discussions, models, and posters to present general guidelines for heart healthy nutrition including fat, fiber, MyPlate, the role of sodium in heart healthy nutrition, utilization of the nutrition label, and utilization of this knowledge for meal planning. Follow up email sent as well. Written material given at graduation.   Biometrics:  Pre Biometrics - 01/13/21 1028       Pre Biometrics   Height 5' 8.4" (1.737 m)    Weight 172 lb 4.8 oz (78.2 kg)    BMI (Calculated) 25.9    Single Leg Stand 30 seconds              Nutrition Therapy Plan and Nutrition Goals:  Nutrition Therapy & Goals - 01/13/21 1028       Intervention Plan   Intervention Prescribe, educate  and counsel regarding individualized specific dietary modifications aiming towards targeted core components such as weight, hypertension, lipid management, diabetes, heart failure and other comorbidities.    Expected Outcomes Short Term Goal: Understand basic principles of dietary content, such as calories, fat, sodium, cholesterol and nutrients.;Short Term Goal: A plan has been developed with personal nutrition goals set during dietitian appointment.;Long Term Goal: Adherence to prescribed nutrition plan.             Nutrition Assessments:  MEDIFICTS Score Key: ?70 Need to make dietary changes  40-70 Heart Healthy Diet ? 40 Therapeutic Level Cholesterol Diet  Flowsheet Row Cardiac Rehab from 01/13/2021 in [facility] Cardiac and Pulmonary Rehab  Picture Your Plate Total Score on Admission 73      Picture Your Plate Scores: [id number] Unhealthy dietary pattern with much room for improvement. 41-50 Dietary pattern unlikely to meet recommendations for good health and room for improvement. 51-60 More healthful dietary pattern, with some room for improvement.  >60 Healthy dietary pattern, although there  may be some specific behaviors that could be improved.    Nutrition Goals Re-Evaluation:   Nutrition Goals Discharge (Final Nutrition Goals Re-Evaluation):   Psychosocial: Target Goals: Acknowledge presence or absence of significant depression and/or stress, maximize coping skills, provide positive support system. Participant is able to verbalize types and ability to use techniques and skills needed for reducing stress and depression.   Education: Stress, Anxiety, and Depression - Group verbal and visual presentation to define topics covered.  Reviews how body is impacted by stress, anxiety, and depression.  Also discusses healthy ways to reduce stress and to treat/manage anxiety and depression.  Written material given at graduation. Flowsheet Row Cardiac Rehab from 01/13/2021 in [facility] Cardiac and Pulmonary Rehab  Education need identified 01/13/21       Education: Sleep Hygiene -Provides group verbal and written instruction about how sleep can affect your health.  Define sleep hygiene, discuss sleep cycles and impact of sleep habits. Review good sleep hygiene tips.    Initial Review & Psychosocial Screening:  Initial Psych Review & Screening - 01/03/21 1113       Initial Review   Current issues with None Identified      Family Dynamics   Good Support System? Yes    Comments Brynden lost his wife two years ago and is support system is his sister in Sports [employer]. He reports no issues with his mental health.      Barriers   Psychosocial barriers to participate in program The patient should benefit from training in stress management and relaxation.      Screening Interventions   Interventions Encouraged to exercise;Provide feedback about the scores to participant;To provide support and resources with identified psychosocial needs    Expected Outcomes Short Term goal: Utilizing psychosocial counselor, staff and physician to assist with identification of specific Stressors or current issues  interfering with healing process. Setting desired goal for each stressor or current issue identified.;Long Term Goal: Stressors or current issues are controlled or eliminated.;Short Term goal: Identification and review with participant of any Quality of Life or Depression concerns found by scoring the questionnaire.;Long Term goal: The participant improves quality of Life and PHQ9 Scores as seen by post scores and/or verbalization of changes             Quality of Life Scores:   Quality of Life - 01/13/21 1028       Quality of Life   Select Quality of Life  Quality of Life Scores   Health/Function Pre 27.27 %    Socioeconomic Pre 24.5 %    Psych/Spiritual Pre 29.64 %    Family Pre 15 %   wife passed two years ago and no kids   GLOBAL Pre 25.36 %            Scores of 19 and below usually indicate a poorer quality of life in these areas.  A difference of  2-3 points is a clinically meaningful difference.  A difference of 2-3 points in the total score of the Quality of Life Index has been associated with significant improvement in overall quality of life, self-image, physical symptoms, and general health in studies assessing change in quality of life.  PHQ-9: Recent Review Flowsheet Data     Depression screen [facility] 2/9 01/13/2021   Decreased Interest 0   Down, Depressed, Hopeless 0   PHQ - 2 Score 0   Altered sleeping 0   Tired, decreased energy 0   Change in appetite 0   Feeling bad or failure about yourself  0   Trouble concentrating 0   Moving slowly or fidgety/restless 0   Suicidal thoughts 0   PHQ-9 Score 0   Difficult doing work/chores Not difficult at all      Interpretation of Total Score  Total Score Depression Severity:  1-4 = Minimal depression, 5-9 = Mild depression, 10-14 = Moderate depression, 15-19 = Moderately severe depression, 20-27 = Severe depression   Psychosocial Evaluation and Intervention:  Psychosocial Evaluation - 01/03/21 1114        Psychosocial Evaluation & Interventions   Interventions Encouraged to exercise with the program and follow exercise prescription;Relaxation education;Stress management education    Comments Cleave lost his wife two years ago and is support system is his sister in Sports coach. He reports no issues with his mental health.    Expected Outcomes Short: Start HeartTrack to help with mood. Long: Maintain a healthy mental state    Continue Psychosocial Services  Follow up required by staff             Psychosocial Re-Evaluation:   Psychosocial Discharge (Final Psychosocial Re-Evaluation):   Vocational Rehabilitation: Provide vocational rehab assistance to qualifying candidates.   Vocational Rehab Evaluation & Intervention:   Education: Education Goals: Education classes will be provided on a variety of topics geared toward better understanding of heart health and risk factor modification. Participant will state understanding/return demonstration of topics presented as noted by education test scores.  Learning Barriers/Preferences:  Learning Barriers/Preferences - 01/03/21 1112       Learning Barriers/Preferences   Learning Barriers None    Learning Preferences None             General Cardiac Education Topics:  AED/CPR: - Group verbal and written instruction with the use of models to demonstrate the basic use of the AED with the basic ABC's of resuscitation.   Anatomy and Cardiac Procedures: - Group verbal and visual presentation and models provide information about basic cardiac anatomy and function. Reviews the testing methods done to diagnose heart disease and the outcomes of the test results. Describes the treatment choices: Medical Management, Angioplasty, or Coronary Bypass Surgery for treating various heart conditions including Myocardial Infarction, Angina, Valve Disease, and Cardiac Arrhythmias.  Written material given at graduation. Flowsheet Row Cardiac Rehab from 01/13/2021  in [facility] Cardiac and Pulmonary Rehab  Education need identified 01/13/21       Medication Safety: - Group verbal and visual  instruction to review commonly prescribed medications for heart and lung disease. Reviews the medication, class of the drug, and side effects. Includes the steps to properly store meds and maintain the prescription regimen.  Written material given at graduation.   Intimacy: - Group verbal instruction through game format to discuss how heart and lung disease can affect sexual intimacy. Written material given at graduation..   Know Your Numbers and Heart Failure: - Group verbal and visual instruction to discuss disease risk factors for cardiac and pulmonary disease and treatment options.  Reviews associated critical values for Overweight/Obesity, Hypertension, Cholesterol, and Diabetes.  Discusses basics of heart failure: signs/symptoms and treatments.  Introduces Heart Failure Zone chart for action plan for heart failure.  Written material given at graduation.   Infection Prevention: - Provides verbal and written material to individual with discussion of infection control including proper hand washing and proper equipment cleaning during exercise session. Flowsheet Row Cardiac Rehab from 01/13/2021 in [facility] Cardiac and Pulmonary Rehab  Date 01/03/21  Educator jh  Instruction Review Code 1- Verbalizes Understanding       Falls Prevention: - Provides verbal and written material to individual with discussion of falls prevention and safety. Flowsheet Row Cardiac Rehab from 01/13/2021 in [facility] Cardiac and Pulmonary Rehab  Date 01/03/21  Educator jh  Instruction Review Code 1- Verbalizes Understanding       Other: -Provides group and verbal instruction on various topics (see comments)   Knowledge Questionnaire Score:  Knowledge Questionnaire Score - 01/13/21 1029       Knowledge Questionnaire Score   Pre Score 22/26             Core Components/Risk  Factors/Patient Goals at Admission:  Personal Goals and Risk Factors at Admission - 01/13/21 1029       Core Components/Risk Factors/Patient Goals on Admission    Weight Management Yes;Weight Maintenance    Intervention Weight Management: Develop a combined nutrition and exercise program designed to reach desired caloric intake, while maintaining appropriate intake of nutrient and fiber, sodium and fats, and appropriate energy expenditure required for the weight goal.;Weight Management: Provide education and appropriate resources to help participant work on and attain dietary goals.;Weight Management/Obesity: Establish reasonable short term and long term weight goals.    Admit Weight 172 lb 4.8 oz (78.2 kg)    Goal Weight: Short Term 172 lb (78 kg)    Goal Weight: Long Term 172 lb (78 kg)    Expected Outcomes Short Term: Continue to assess and modify interventions until short term weight is achieved;Long Term: Adherence to nutrition and physical activity/exercise program aimed toward attainment of established weight goal;Weight Maintenance: Understanding of the daily nutrition guidelines, which includes 25-35% calories from fat, 7% or less cal from saturated fats, less than 200mg  cholesterol, less than 1.5gm of sodium, & 5 or more servings of fruits and vegetables daily;Understanding recommendations for meals to include 15-35% energy as protein, 25-35% energy from fat, 35-60% energy from carbohydrates, less than 200mg  of dietary cholesterol, 20-35 gm of total fiber daily;Understanding of distribution of calorie intake throughout the day with the consumption of 4-5 meals/snacks    Diabetes Yes    Intervention Provide education about signs/symptoms and action to take for hypo/hyperglycemia.;Provide education about proper nutrition, including hydration, and aerobic/resistive exercise prescription along with prescribed medications to achieve blood glucose in normal ranges: Fasting glucose 65-99 mg/dL     Expected Outcomes Short Term: Participant verbalizes understanding of the signs/symptoms and immediate care of hyper/hypoglycemia, proper  foot care and importance of medication, aerobic/resistive exercise and nutrition plan for blood glucose control.;Long Term: Attainment of HbA1C < 7%.    Hypertension Yes    Intervention Provide education on lifestyle modifcations including regular physical activity/exercise, weight management, moderate sodium restriction and increased consumption of fresh fruit, vegetables, and low fat dairy, alcohol moderation, and smoking cessation.;Monitor prescription use compliance.    Expected Outcomes Short Term: Continued assessment and intervention until BP is < 140/64mm HG in hypertensive participants. < 130/38mm HG in hypertensive participants with diabetes, heart failure or chronic kidney disease.;Long Term: Maintenance of blood pressure at goal levels.             Education:Diabetes - Individual verbal and written instruction to review signs/symptoms of diabetes, desired ranges of glucose level fasting, after meals and with exercise. Acknowledge that pre and post exercise glucose checks will be done for 3 sessions at entry of program. [place] from 01/13/2021 in [facility] Cardiac and Pulmonary Rehab  Date 01/03/21  Educator jh  Instruction Review Code 1- Verbalizes Understanding       Core Components/Risk Factors/Patient Goals Review:    Core Components/Risk Factors/Patient Goals at Discharge (Final Review):    ITP Comments:  ITP Comments     Row Name 01/03/21 1110 01/13/21 1025         ITP Comments Virtual Visit completed. Patient informed on EP and RD appointment and 6 Minute walk test. Patient also informed of patient health questionnaires on My Chart. Patient Verbalizes understanding. Visit diagnosis can be found in [facility] 12/09/2020. Completed 6MWT and gym orientation. Initial ITP created and sent for review to Dr. Emily Filbert, Medical  Director.               Comments: Initial ITP

## 2021-01-13 NOTE — Patient Instructions (Signed)
Patient Instructions  Patient Details  Name: Thomas Rice MRN: [id number] Date of Birth: January 08, 1946 Referring Provider:  Andrez Grime, MD  Below are your personal goals for exercise, nutrition, and risk factors. Our goal is to help you stay on track towards obtaining and maintaining these goals. We will be discussing your progress on these goals with you throughout the program.  Initial Exercise Prescription:  Initial Exercise Prescription - 01/13/21 1000       Date of Initial Exercise RX and Referring Provider   Date 01/13/21    Referring Provider Donnelly Angelica MD      Oxygen   Maintain Oxygen Saturation 88% or higher      Treadmill   MPH 2.4    Grade 1    Minutes 15    METs 3.17      NuStep   Level 3    SPM 80    Minutes 15    METs 3      Recumbant Elliptical   Level 1    RPM 50    Minutes 15    METs 2      Prescription Details   Frequency (times per week) 3    Duration Progress to 30 minutes of continuous aerobic without signs/symptoms of physical distress      Intensity   THRR 40-80% of Max Heartrate 99-130    Ratings of Perceived Exertion 11-13    Perceived Dyspnea 0-4      Progression   Progression Continue to progress workloads to maintain intensity without signs/symptoms of physical distress.      Resistance Training   Training Prescription Yes    Weight 4 lb    Reps 10-15             Exercise Goals: Frequency: Be able to perform aerobic exercise two to three times per week in program working toward 2-5 days per week of home exercise.  Intensity: Work with a perceived exertion of 11 (fairly light) - 15 (hard) while following your exercise prescription.  We will make changes to your prescription with you as you progress through the program.   Duration: Be able to do 30 to 45 minutes of continuous aerobic exercise in addition to a 5 minute warm-up and a 5 minute cool-down routine.   Nutrition Goals: Your personal nutrition goals will  be established when you do your nutrition analysis with the dietician.  The following are general nutrition guidelines to follow: Cholesterol < 200mg /day Sodium < 1500mg /day Fiber: Men over 50 yrs - 30 grams per day  Personal Goals:  Personal Goals and Risk Factors at Admission - 01/13/21 1029       Core Components/Risk Factors/Patient Goals on Admission    Weight Management Yes;Weight Maintenance    Intervention Weight Management: Develop a combined nutrition and exercise program designed to reach desired caloric intake, while maintaining appropriate intake of nutrient and fiber, sodium and fats, and appropriate energy expenditure required for the weight goal.;Weight Management: Provide education and appropriate resources to help participant work on and attain dietary goals.;Weight Management/Obesity: Establish reasonable short term and long term weight goals.    Admit Weight 172 lb 4.8 oz (78.2 kg)    Goal Weight: Short Term 172 lb (78 kg)    Goal Weight: Long Term 172 lb (78 kg)    Expected Outcomes Short Term: Continue to assess and modify interventions until short term weight is achieved;Long Term: Adherence to nutrition and physical activity/exercise program aimed toward attainment  of established weight goal;Weight Maintenance: Understanding of the daily nutrition guidelines, which includes 25-35% calories from fat, 7% or less cal from saturated fats, less than 200mg  cholesterol, less than 1.5gm of sodium, & 5 or more servings of fruits and vegetables daily;Understanding recommendations for meals to include 15-35% energy as protein, 25-35% energy from fat, 35-60% energy from carbohydrates, less than 200mg  of dietary cholesterol, 20-35 gm of total fiber daily;Understanding of distribution of calorie intake throughout the day with the consumption of 4-5 meals/snacks    Diabetes Yes    Intervention Provide education about signs/symptoms and action to take for hypo/hyperglycemia.;Provide  education about proper nutrition, including hydration, and aerobic/resistive exercise prescription along with prescribed medications to achieve blood glucose in normal ranges: Fasting glucose 65-99 mg/dL    Expected Outcomes Short Term: Participant verbalizes understanding of the signs/symptoms and immediate care of hyper/hypoglycemia, proper foot care and importance of medication, aerobic/resistive exercise and nutrition plan for blood glucose control.;Long Term: Attainment of HbA1C < 7%.    Hypertension Yes    Intervention Provide education on lifestyle modifcations including regular physical activity/exercise, weight management, moderate sodium restriction and increased consumption of fresh fruit, vegetables, and low fat dairy, alcohol moderation, and smoking cessation.;Monitor prescription use compliance.    Expected Outcomes Short Term: Continued assessment and intervention until BP is < 140/72mm HG in hypertensive participants. < 130/69mm HG in hypertensive participants with diabetes, heart failure or chronic kidney disease.;Long Term: Maintenance of blood pressure at goal levels.             Tobacco Use Initial Evaluation: Social History   Tobacco Use  Smoking Status Never  Smokeless Tobacco Never    Exercise Goals and Review:  Exercise Goals     Row Name 01/13/21 1027             Exercise Goals   Increase Physical Activity Yes       Intervention Develop an individualized exercise prescription for aerobic and resistive training based on initial evaluation findings, risk stratification, comorbidities and participant's personal goals.;Provide advice, education, support and counseling about physical activity/exercise needs.       Expected Outcomes Short Term: Attend rehab on a regular basis to increase amount of physical activity.;Long Term: Add in home exercise to make exercise part of routine and to increase amount of physical activity.;Long Term: Exercising regularly at least  3-5 days a week.       Increase Strength and Stamina Yes       Intervention Provide advice, education, support and counseling about physical activity/exercise needs.;Develop an individualized exercise prescription for aerobic and resistive training based on initial evaluation findings, risk stratification, comorbidities and participant's personal goals.       Expected Outcomes Short Term: Increase workloads from initial exercise prescription for resistance, speed, and METs.;Short Term: Perform resistance training exercises routinely during rehab and add in resistance training at home;Long Term: Improve cardiorespiratory fitness, muscular endurance and strength as measured by increased METs and functional capacity (91m)       Able to understand and use rate of perceived exertion (RPE) scale Yes       Intervention Provide education and explanation on how to use RPE scale       Expected Outcomes Long Term:  Able to use RPE to guide intensity level when exercising independently;Short Term: Able to use RPE daily in rehab to express subjective intensity level       Able to understand and use Dyspnea scale Yes  Intervention Provide education and explanation on how to use Dyspnea scale       Expected Outcomes Short Term: Able to use Dyspnea scale daily in rehab to express subjective sense of shortness of breath during exertion;Long Term: Able to use Dyspnea scale to guide intensity level when exercising independently       Knowledge and understanding of Target Heart Rate Range (THRR) Yes       Intervention Provide education and explanation of THRR including how the numbers were predicted and where they are located for reference       Expected Outcomes Short Term: Able to state/look up THRR;Short Term: Able to use daily as guideline for intensity in rehab;Long Term: Able to use THRR to govern intensity when exercising independently       Able to check pulse independently Yes       Intervention Provide  education and demonstration on how to check pulse in carotid and radial arteries.;Review the importance of being able to check your own pulse for safety during independent exercise       Expected Outcomes Short Term: Able to explain why pulse checking is important during independent exercise;Long Term: Able to check pulse independently and accurately       Understanding of Exercise Prescription Yes       Intervention Provide education, explanation, and written materials on patient's individual exercise prescription       Expected Outcomes Short Term: Able to explain program exercise prescription;Long Term: Able to explain home exercise prescription to exercise independently                Copy of goals given to participant.

## 2021-01-17 ENCOUNTER — Other Ambulatory Visit: Payer: Self-pay

## 2021-01-17 DIAGNOSIS — Z955 Presence of coronary angioplasty implant and graft: Secondary | ICD-10-CM

## 2021-01-17 LAB — GLUCOSE, CAPILLARY
Glucose-Capillary: 273 mg/dL — ABNORMAL HIGH (ref 70–99)
Glucose-Capillary: 219 mg/dL — ABNORMAL HIGH (ref 70–99)

## 2021-01-17 NOTE — Progress Notes (Signed)
Daily Session Note  Patient Details  Name: Thomas Rice MRN: [id number] Date of Birth: [birth year] Referring Provider:   Flowsheet Row Cardiac Rehab from 01/13/2021 in [facility] Cardiac and Pulmonary Rehab  Referring Provider Donnelly Angelica MD       Encounter Date: 01/17/2021  Check In:  Session Check In - 01/17/21 0741       Check-In   Supervising physician immediately available to respond to emergencies See telemetry face sheet for immediately available ER MD    Location [facility]-Cardiac & Pulmonary Rehab    Staff Present Birdie Sons, MPA, Mauricia Area, BS, ACSM CEP, Exercise Physiologist;Joseph Tessie Fass, [place]    Virtual Visit No    Medication changes reported     No    Fall or balance concerns reported    No    Warm-up and Cool-down Performed on first and last piece of equipment    Resistance Training Performed Yes    VAD Patient? No    PAD/SET Patient? No      Pain Assessment   Currently in Pain? No/denies                Social History   Tobacco Use  Smoking Status Never  Smokeless Tobacco Never    Goals Met:  Independence with exercise equipment Exercise tolerated well No report of concerns or symptoms today Strength training completed today  Goals Unmet:  Not Applicable  Comments: First full day of exercise!  Patient was oriented to gym and equipment including functions, settings, policies, and procedures.  Patient's individual exercise prescription and treatment plan were reviewed.  All starting workloads were established based on the results of the 6 minute walk test done at initial orientation visit.  The plan for exercise progression was also introduced and progression will be customized based on patient's performance and goals.    Dr. Emily Filbert is Medical Director for [place].  Dr. Ottie Glazier is Medical Director for [facility] Pulmonary Rehabilitation.

## 2021-01-19 ENCOUNTER — Other Ambulatory Visit: Payer: Self-pay

## 2021-01-19 DIAGNOSIS — Z955 Presence of coronary angioplasty implant and graft: Secondary | ICD-10-CM | POA: Diagnosis not present

## 2021-01-19 LAB — GLUCOSE, CAPILLARY
Glucose-Capillary: 266 mg/dL — ABNORMAL HIGH (ref 70–99)
Glucose-Capillary: 209 mg/dL — ABNORMAL HIGH (ref 70–99)

## 2021-01-19 NOTE — Progress Notes (Signed)
Daily Session Note  Patient Details  Name: WANDELL SCULLION MRN: [id number] Date of Birth: August 13, 1945 Referring Provider:   Flowsheet Row Cardiac Rehab from 01/13/2021 in [facility] Cardiac and Pulmonary Rehab  Referring Provider Donnelly Angelica MD       Encounter Date: 01/19/2021  Check In:  Session Check In - 01/19/21 0758       Check-In   Supervising physician immediately available to respond to emergencies See telemetry face sheet for immediately available ER MD    Location [facility]-Cardiac & Pulmonary Rehab    Staff Present Birdie Sons, MPA, Elveria Rising, BA, ACSM CEP, Exercise Physiologist;Joseph Tessie Fass, [place]    Virtual Visit No    Medication changes reported     No    Fall or balance concerns reported    No    Warm-up and Cool-down Performed on first and last piece of equipment    Resistance Training Performed Yes    VAD Patient? No    PAD/SET Patient? No      Pain Assessment   Currently in Pain? No/denies                Social History   Tobacco Use  Smoking Status Never  Smokeless Tobacco Never    Goals Met:  Independence with exercise equipment Exercise tolerated well No report of concerns or symptoms today Strength training completed today  Goals Unmet:  Not Applicable  Comments: Pt able to follow exercise prescription today without complaint.  Will continue to monitor for progression.    Dr. Emily Filbert is Medical Director for [place].  Dr. Ottie Glazier is Medical Director for [facility] Pulmonary Rehabilitation.

## 2021-01-21 ENCOUNTER — Other Ambulatory Visit: Payer: Self-pay

## 2021-01-21 DIAGNOSIS — Z955 Presence of coronary angioplasty implant and graft: Secondary | ICD-10-CM | POA: Diagnosis not present

## 2021-01-21 LAB — GLUCOSE, CAPILLARY
Glucose-Capillary: 274 mg/dL — ABNORMAL HIGH (ref 70–99)
Glucose-Capillary: 214 mg/dL — ABNORMAL HIGH (ref 70–99)

## 2021-01-21 NOTE — Progress Notes (Signed)
Daily Session Note  Patient Details  Name: Thomas Rice MRN: [id number] Date of Birth: [birth year] Referring Provider:   Flowsheet Row Cardiac Rehab from 01/13/2021 in [facility] Cardiac and Pulmonary Rehab  Referring Provider Donnelly Angelica MD       Encounter Date: 01/21/2021  Check In:  Session Check In - 01/21/21 0756       Check-In   Supervising physician immediately available to respond to emergencies See telemetry face sheet for immediately available ER MD    Location [facility]-Cardiac & Pulmonary Rehab    Staff Present Alberteen Sam, MA, RCEP, CCRP, CCET;Blaine Guiffre, RN, BSN;Joseph [place] [facility] [place], [place]    Virtual Visit No    Medication changes reported     No    Fall or balance concerns reported    No    Warm-up and Cool-down Performed on first and last piece of equipment    Resistance Training Performed Yes    VAD Patient? No    PAD/SET Patient? No      Pain Assessment   Currently in Pain? No/denies                Social History   Tobacco Use  Smoking Status Never  Smokeless Tobacco Never    Goals Met:  Independence with exercise equipment Exercise tolerated well No report of concerns or symptoms today Strength training completed today  Goals Unmet:  Not Applicable  Comments: Pt able to follow exercise prescription today without complaint.  Will continue to monitor for progression.   Dr. Emily Filbert is Medical Director for [place].  Dr. Ottie Glazier is Medical Director for [facility] Pulmonary Rehabilitation.

## 2021-01-24 ENCOUNTER — Other Ambulatory Visit: Payer: Self-pay

## 2021-01-24 ENCOUNTER — Encounter: Payer: Medicare HMO | Admitting: *Deleted

## 2021-01-24 DIAGNOSIS — Z955 Presence of coronary angioplasty implant and graft: Secondary | ICD-10-CM | POA: Diagnosis not present

## 2021-01-24 NOTE — Progress Notes (Signed)
Completed initial RD consultation ?

## 2021-01-24 NOTE — Progress Notes (Signed)
Daily Session Note  Patient Details  Name: Thomas Rice MRN: [id number] Date of Birth: 27-Apr-1945 Referring Provider:   Flowsheet Row Cardiac Rehab from 01/13/2021 in [facility] Cardiac and Pulmonary Rehab  Referring Provider Donnelly Angelica MD       Encounter Date: 01/24/2021  Check In:  Session Check In - 01/24/21 0829       Check-In   Supervising physician immediately available to respond to emergencies See telemetry face sheet for immediately available ER MD    Location [facility]-Cardiac & Pulmonary Rehab    Staff Present Heath Lark, RN, BSN, Laveda Norman, BS, ACSM CEP, Exercise Physiologist;Joseph [place], [place]    Virtual Visit No    Medication changes reported     No    Fall or balance concerns reported    No    Warm-up and Cool-down Performed on first and last piece of equipment    Resistance Training Performed Yes    VAD Patient? No    PAD/SET Patient? No      Pain Assessment   Currently in Pain? No/denies                Social History   Tobacco Use  Smoking Status Never  Smokeless Tobacco Never    Goals Met:  Independence with exercise equipment Exercise tolerated well No report of concerns or symptoms today  Goals Unmet:  Not Applicable  Comments: Pt able to follow exercise prescription today without complaint.  Will continue to monitor for progression.    Dr. Emily Filbert is Medical Director for [place].  Dr. Ottie Glazier is Medical Director for [facility] Pulmonary Rehabilitation.

## 2021-01-24 NOTE — Progress Notes (Deleted)
Daily Session Note  Patient Details  Name: Thomas Rice MRN: [id number] Date of Birth: 08-20-1945 Referring Provider:   Flowsheet Row Cardiac Rehab from 01/13/2021 in [facility] Cardiac and Pulmonary Rehab  Referring Provider Donnelly Angelica MD       Encounter Date: 01/24/2021  Check In:  Session Check In - 01/24/21 0810       Check-In   Supervising physician immediately available to respond to emergencies See telemetry face sheet for immediately available ER MD    Location [facility]-Cardiac & Pulmonary Rehab    Staff Present Heath Lark, RN, BSN, Laveda Norman, BS, ACSM CEP, Exercise Physiologist;Joseph [place], [place]    Virtual Visit No    Medication changes reported     No    Fall or balance concerns reported    No    Warm-up and Cool-down Performed on first and last piece of equipment    Resistance Training Performed Yes    VAD Patient? No    PAD/SET Patient? No      Pain Assessment   Currently in Pain? No/denies                Social History   Tobacco Use  Smoking Status Never  Smokeless Tobacco Never    Goals Met:  Independence with exercise equipment Exercise tolerated well No report of concerns or symptoms today  Goals Unmet:  Not Applicable  Comments: Pt able to follow exercise prescription today without complaint.  Will continue to monitor for progression.    Dr. Emily Filbert is Medical Director for [place].  Dr. Ottie Glazier is Medical Director for [facility] Pulmonary Rehabilitation.

## 2021-01-26 ENCOUNTER — Other Ambulatory Visit: Payer: Self-pay

## 2021-01-26 DIAGNOSIS — Z955 Presence of coronary angioplasty implant and graft: Secondary | ICD-10-CM

## 2021-01-26 NOTE — Progress Notes (Signed)
Daily Session Note  Patient Details  Name: Thomas Rice MRN: [id number] Date of Birth: [birth year] Referring Provider:   Flowsheet Row Cardiac Rehab from 01/13/2021 in [facility] Cardiac and Pulmonary Rehab  Referring Provider Donnelly Angelica MD       Encounter Date: 01/26/2021  Check In:  Session Check In - 01/26/21 0743       Check-In   Supervising physician immediately available to respond to emergencies See telemetry face sheet for immediately available ER MD    Location [facility]-Cardiac & Pulmonary Rehab    Staff Present Birdie Sons, MPA, Elveria Rising, BA, ACSM CEP, Exercise Physiologist;Joseph Tessie Fass, [place]    Virtual Visit No    Medication changes reported     No    Fall or balance concerns reported    No    Warm-up and Cool-down Performed on first and last piece of equipment    Resistance Training Performed Yes    VAD Patient? No    PAD/SET Patient? No      Pain Assessment   Currently in Pain? No/denies                Social History   Tobacco Use  Smoking Status Never  Smokeless Tobacco Never    Goals Met:  Independence with exercise equipment Exercise tolerated well No report of concerns or symptoms today Strength training completed today  Goals Unmet:  Not Applicable  Comments: Pt able to follow exercise prescription today without complaint.  Will continue to monitor for progression.    Dr. Emily Filbert is Medical Director for [place].  Dr. Ottie Glazier is Medical Director for [facility] Pulmonary Rehabilitation.

## 2021-01-28 ENCOUNTER — Other Ambulatory Visit: Payer: Self-pay

## 2021-01-28 ENCOUNTER — Encounter: Payer: Medicare HMO | Admitting: *Deleted

## 2021-01-28 DIAGNOSIS — Z955 Presence of coronary angioplasty implant and graft: Secondary | ICD-10-CM

## 2021-01-28 NOTE — Progress Notes (Signed)
Daily Session Note  Patient Details  Name: Thomas Rice MRN: [id number] Date of Birth: 07/31/1945 Referring Provider:   Flowsheet Row Cardiac Rehab from 01/13/2021 in [facility] Cardiac and Pulmonary Rehab  Referring Provider Donnelly Angelica MD       Encounter Date: 01/28/2021  Check In:  Session Check In - 01/28/21 0759       Check-In   Supervising physician immediately available to respond to emergencies See telemetry face sheet for immediately available ER MD    Location [facility]-Cardiac & Pulmonary Rehab    Staff Present Heath Lark, RN, BSN, CCRP;Jessica [place] Wheeler, MA, RCEP, CCRP, CCET;Joseph [place], [place]    Virtual Visit No    Medication changes reported     No    Fall or balance concerns reported    No    Warm-up and Cool-down Performed on first and last piece of equipment    Resistance Training Performed Yes    VAD Patient? No    PAD/SET Patient? No      Pain Assessment   Currently in Pain? No/denies                Social History   Tobacco Use  Smoking Status Never  Smokeless Tobacco Never    Goals Met:  Independence with exercise equipment Exercise tolerated well No report of concerns or symptoms today  Goals Unmet:  Not Applicable  Comments: Pt able to follow exercise prescription today without complaint.  Will continue to monitor for progression.    Dr. Emily Filbert is Medical Director for [place].  Dr. Ottie Glazier is Medical Director for [employer] Pulmonary Rehabilitation.

## 2021-02-02 ENCOUNTER — Other Ambulatory Visit: Payer: Self-pay

## 2021-02-02 ENCOUNTER — Encounter: Payer: Self-pay | Admitting: *Deleted

## 2021-02-02 ENCOUNTER — Encounter: Payer: Medicare HMO | Admitting: *Deleted

## 2021-02-02 DIAGNOSIS — Z955 Presence of coronary angioplasty implant and graft: Secondary | ICD-10-CM

## 2021-02-02 NOTE — Progress Notes (Signed)
Daily Session Note  Patient Details  Name: Thomas Rice MRN: [id number] Date of Birth: [birth year] Referring Provider:   Flowsheet Row Cardiac Rehab from 01/13/2021 in [facility] Cardiac and Pulmonary Rehab  Referring Provider Donnelly Angelica MD       Encounter Date: 02/02/2021  Check In:  Session Check In - 02/02/21 0806       Check-In   Supervising physician immediately available to respond to emergencies See telemetry face sheet for immediately available ER MD    Location [facility]-Cardiac & Pulmonary Rehab    Staff Present Heath Lark, RN, BSN, CCRP;Melissa [place], RDN, LDN;Joseph [place], [place]    Virtual Visit No    Medication changes reported     No    Fall or balance concerns reported    No    Warm-up and Cool-down Performed on first and last piece of equipment    Resistance Training Performed Yes    VAD Patient? No    PAD/SET Patient? No      Pain Assessment   Currently in Pain? No/denies                Social History   Tobacco Use  Smoking Status Never  Smokeless Tobacco Never    Goals Met:  Independence with exercise equipment Exercise tolerated well No report of concerns or symptoms today  Goals Unmet:  Not Applicable  Comments: Pt able to follow exercise prescription today without complaint.  Will continue to monitor for progression.    Dr. Emily Filbert is Medical Director for [place].  Dr. Ottie Glazier is Medical Director for [facility] Pulmonary Rehabilitation.

## 2021-02-02 NOTE — Progress Notes (Signed)
Cardiac Individual Treatment Plan  Patient Details  Name: Thomas Rice MRN: [id number] Date of Birth: Apr 06, 1945 Referring Provider:   Flowsheet Row Cardiac Rehab from 01/13/2021 in [facility] Cardiac and Pulmonary Rehab  Referring Provider Donnelly Angelica MD       Initial Encounter Date:  Flowsheet Row Cardiac Rehab from 01/13/2021 in [facility] Cardiac and Pulmonary Rehab  Date 01/13/21       Visit Diagnosis: Status post coronary artery stent placement  Patient's Home Medications on Admission:  Current Outpatient Medications:    amLODipine (NORVASC) 5 MG tablet, Take 1 tablet (5 mg total) by mouth daily., Disp: 30 tablet, Rfl: 0   aspirin EC 81 MG tablet, Take 81 mg by mouth at bedtime., Disp: , Rfl:    atorvastatin (LIPITOR) 40 MG tablet, Take 40 mg by mouth daily., Disp: , Rfl:    clopidogrel (PLAVIX) 75 MG tablet, Take 1 tablet (75 mg total) by mouth daily., Disp: 30 tablet, Rfl: 2   ergocalciferol (VITAMIN D2) 1.25 MG (50000 UT) capsule, Take 50,000 Units by mouth every 30 (thirty) days., Disp: , Rfl:    glipiZIDE (GLUCOTROL XL) 5 MG 24 hr tablet, Take 10 mg by mouth daily., Disp: , Rfl:    latanoprost (XALATAN) 0.005 % ophthalmic solution, Place 1 drop into both eyes at bedtime., Disp: , Rfl:    linagliptin (TRADJENTA) 5 MG TABS tablet, Take 5 mg by mouth daily., Disp: , Rfl:    linagliptin (TRADJENTA) 5 MG TABS tablet, Take 1 tablet by mouth daily. (Patient not taking: Reported on 01/03/2021), Disp: , Rfl:    metoprolol succinate (TOPROL-XL) 25 MG 24 hr tablet, Take 25 mg by mouth daily., Disp: , Rfl:    nitroGLYCERIN (NITROSTAT) 0.4 MG SL tablet, Place 0.4 mg under the tongue every 5 (five) minutes x 3 doses as needed for chest pain., Disp: , Rfl:    triamcinolone cream (KENALOG) 0.1 %, Apply topically 2 (two) times daily., Disp: , Rfl:    vitamin B-12 (CYANOCOBALAMIN) 500 MCG tablet, Take 1 tablet (500 mcg total) by mouth daily., Disp: 30 tablet, Rfl: 2  Past Medical History: Past  Medical History:  Diagnosis Date   Anemia    Bursitis    Chronic kidney disease    STAGE 3   Diabetes mellitus without complication (HCC)    Hyperkalemia    Hypertension     Tobacco Use: Social History   Tobacco Use  Smoking Status Never  Smokeless Tobacco Never    Labs: Recent Review Flowsheet Data     Labs for ITP Cardiac and Pulmonary Rehab Latest Ref Rng & Units 12/01/2020 12/02/2020   Cholestrol 0 - 200 mg/dL - 81   LDLCALC 0 - 99 mg/dL - 32   HDL >40 mg/dL - 33(L)   Trlycerides <150 mg/dL - 82   Hemoglobin A1c 4.8 - 5.6 % 7.6(H) -        Exercise Target Goals: Exercise Program Goal: Individual exercise prescription set using results from initial 6 min walk test and THRR while considering  patients activity barriers and safety.   Exercise Prescription Goal: Initial exercise prescription builds to 30-45 minutes a day of aerobic activity, 2-3 days per week.  Home exercise guidelines will be given to patient during program as part of exercise prescription that the participant will acknowledge.   Education: Aerobic Exercise: - Group verbal and visual presentation on the components of exercise prescription. Introduces F.I.T.T principle from ACSM for exercise prescriptions.  Reviews F.I.T.T. principles of  aerobic exercise including progression. Written material given at graduation. Flowsheet Row Cardiac Rehab from 01/26/2021 in [facility] Cardiac and Pulmonary Rehab  Education need identified 01/13/21       Education: Resistance Exercise: - Group verbal and visual presentation on the components of exercise prescription. Introduces F.I.T.T principle from ACSM for exercise prescriptions  Reviews F.I.T.T. principles of resistance exercise including progression. Written material given at graduation.    Education: Exercise & Equipment Safety: - Individual verbal instruction and demonstration of equipment use and safety with use of the equipment. Flowsheet Row Cardiac Rehab  from 01/26/2021 in [facility] Cardiac and Pulmonary Rehab  Date 01/03/21  Educator jh  Instruction Review Code 1- Verbalizes Understanding       Education: Exercise Physiology & General Exercise Guidelines: - Group verbal and written instruction with models to review the exercise physiology of the cardiovascular system and associated critical values. Provides general exercise guidelines with specific guidelines to those with heart or lung disease.  Flowsheet Row Cardiac Rehab from 01/26/2021 in [facility] Cardiac and Pulmonary Rehab  Date 01/26/21  Educator [facility]  Instruction Review Code 1- Verbalizes Understanding       Education: Flexibility, Balance, Mind/Body Relaxation: - Group verbal and visual presentation with interactive activity on the components of exercise prescription. Introduces F.I.T.T principle from ACSM for exercise prescriptions. Reviews F.I.T.T. principles of flexibility and balance exercise training including progression. Also discusses the mind body connection.  Reviews various relaxation techniques to help reduce and manage stress (i.e. Deep breathing, progressive muscle relaxation, and visualization). Balance handout provided to take home. Written material given at graduation.   Activity Barriers & Risk Stratification:  Activity Barriers & Cardiac Risk Stratification - 01/13/21 1026       Activity Barriers & Cardiac Risk Stratification   Activity Barriers Muscular Weakness;Deconditioning;Other (comment);Joint Problems    Comments left shoulder limited ROM    Cardiac Risk Stratification Moderate             6 Minute Walk:  6 Minute Walk     Row Name 01/13/21 1025         6 Minute Walk   Phase Initial     Distance 1290 feet     Walk Time 6 minutes     # of Rest Breaks 0     MPH 2.44     METS 3.12     RPE 9     VO2 Peak 10.91     Symptoms No     Resting HR 69 bpm     Resting BP 146/60     Resting Oxygen Saturation  99 %     Exercise Oxygen Saturation   during 6 min walk 98 %     Max Ex. HR 109 bpm     Max Ex. BP 168/74     2 Minute Post BP 136/70              Oxygen Initial Assessment:   Oxygen Re-Evaluation:   Oxygen Discharge (Final Oxygen Re-Evaluation):   Initial Exercise Prescription:  Initial Exercise Prescription - 01/13/21 1000       Date of Initial Exercise RX and Referring Provider   Date 01/13/21    Referring Provider Sena Slate MD      Oxygen   Maintain Oxygen Saturation 88% or higher      Treadmill   MPH 2.4    Grade 1    Minutes 15    METs 3.17      NuStep  Level 3    SPM 80    Minutes 15    METs 3      Recumbant Elliptical   Level 1    RPM 50    Minutes 15    METs 2      Prescription Details   Frequency (times per week) 3    Duration Progress to 30 minutes of continuous aerobic without signs/symptoms of physical distress      Intensity   THRR 40-80% of Max Heartrate 99-130    Ratings of Perceived Exertion 11-13    Perceived Dyspnea 0-4      Progression   Progression Continue to progress workloads to maintain intensity without signs/symptoms of physical distress.      Resistance Training   Training Prescription Yes    Weight 4 lb    Reps 10-15             Perform Capillary Blood Glucose checks as needed.  Exercise Prescription Changes:   Exercise Prescription Changes     Row Name 01/13/21 1000 01/25/21 1200           Response to Exercise   Blood Pressure (Admit) 146/60 152/80      Blood Pressure (Exercise) 168/74 152/64      Blood Pressure (Exit) 136/70 138/72      Heart Rate (Admit) 68 bpm 79 bpm      Heart Rate (Exercise) 109 bpm 114 bpm      Heart Rate (Exit) 77 bpm --      Oxygen Saturation (Admit) 99 % --      Oxygen Saturation (Exercise) 98 % --      Rating of Perceived Exertion (Exercise) 9 13      Symptoms none none      Comments walk test results 4th day      Duration -- Progress to 30 minutes of  aerobic without signs/symptoms of physical  distress      Intensity -- THRR unchanged        Progression   Progression -- Continue to progress workloads to maintain intensity without signs/symptoms of physical distress.      Average METs -- 2.86        Resistance Training   Training Prescription -- Yes      Weight -- 4 lb      Reps -- 10-15        Treadmill   MPH -- 2.8      Grade -- 1.5      Minutes -- 15      METs -- 3.72        Recumbant Elliptical   Level -- 1      RPM -- 50      Minutes -- 15      METs -- 2               Exercise Comments:   Exercise Comments     Row Name 01/17/21 0743           Exercise Comments First full day of exercise!  Patient was oriented to gym and equipment including functions, settings, policies, and procedures.  Patient's individual exercise prescription and treatment plan were reviewed.  All starting workloads were established based on the results of the 6 minute walk test done at initial orientation visit.  The plan for exercise progression was also introduced and progression will be customized based on patient's performance and goals.  Exercise Goals and Review:   Exercise Goals     Row Name 01/13/21 1027             Exercise Goals   Increase Physical Activity Yes       Intervention Develop an individualized exercise prescription for aerobic and resistive training based on initial evaluation findings, risk stratification, comorbidities and participant's personal goals.;Provide advice, education, support and counseling about physical activity/exercise needs.       Expected Outcomes Short Term: Attend rehab on a regular basis to increase amount of physical activity.;Long Term: Add in home exercise to make exercise part of routine and to increase amount of physical activity.;Long Term: Exercising regularly at least 3-5 days a week.       Increase Strength and Stamina Yes       Intervention Provide advice, education, support and counseling about  physical activity/exercise needs.;Develop an individualized exercise prescription for aerobic and resistive training based on initial evaluation findings, risk stratification, comorbidities and participant's personal goals.       Expected Outcomes Short Term: Increase workloads from initial exercise prescription for resistance, speed, and METs.;Short Term: Perform resistance training exercises routinely during rehab and add in resistance training at home;Long Term: Improve cardiorespiratory fitness, muscular endurance and strength as measured by increased METs and functional capacity (6MWT)       Able to understand and use rate of perceived exertion (RPE) scale Yes       Intervention Provide education and explanation on how to use RPE scale       Expected Outcomes Long Term:  Able to use RPE to guide intensity level when exercising independently;Short Term: Able to use RPE daily in rehab to express subjective intensity level       Able to understand and use Dyspnea scale Yes       Intervention Provide education and explanation on how to use Dyspnea scale       Expected Outcomes Short Term: Able to use Dyspnea scale daily in rehab to express subjective sense of shortness of breath during exertion;Long Term: Able to use Dyspnea scale to guide intensity level when exercising independently       Knowledge and understanding of Target Heart Rate Range (THRR) Yes       Intervention Provide education and explanation of THRR including how the numbers were predicted and where they are located for reference       Expected Outcomes Short Term: Able to state/look up THRR;Short Term: Able to use daily as guideline for intensity in rehab;Long Term: Able to use THRR to govern intensity when exercising independently       Able to check pulse independently Yes       Intervention Provide education and demonstration on how to check pulse in carotid and radial arteries.;Review the importance of being able to check your own  pulse for safety during independent exercise       Expected Outcomes Short Term: Able to explain why pulse checking is important during independent exercise;Long Term: Able to check pulse independently and accurately       Understanding of Exercise Prescription Yes       Intervention Provide education, explanation, and written materials on patient's individual exercise prescription       Expected Outcomes Short Term: Able to explain program exercise prescription;Long Term: Able to explain home exercise prescription to exercise independently                Exercise Goals Re-Evaluation :  Exercise Goals Re-Evaluation     Row Name 01/17/21 0743 01/25/21 1251           Exercise Goal Re-Evaluation   Exercise Goals Review Increase Physical Activity;Able to understand and use rate of perceived exertion (RPE) scale;Knowledge and understanding of Target Heart Rate Range (THRR);Understanding of Exercise Prescription;Able to understand and use Dyspnea scale;Able to check pulse independently;Increase Strength and Stamina Increase Physical Activity;Increase Strength and Stamina      Comments Reviewed RPE and dyspnea scales, THR and program prescription with pt today.  Pt voiced understanding and was given a copy of goals to take home. Jabbar is doing well and has increased TM to 2.8 mph and 1.5 incline.  Staff will monitor progress.      Expected Outcomes Short: Use RPE daily to regulate intensity. Long: Follow program prescription in THR. Short: attend consistently Long:  improve overall stamina               Discharge Exercise Prescription (Final Exercise Prescription Changes):  Exercise Prescription Changes - 01/25/21 1200       Response to Exercise   Blood Pressure (Admit) 152/80    Blood Pressure (Exercise) 152/64    Blood Pressure (Exit) 138/72    Heart Rate (Admit) 79 bpm    Heart Rate (Exercise) 114 bpm    Rating of Perceived Exertion (Exercise) 13    Symptoms none    Comments 4th  day    Duration Progress to 30 minutes of  aerobic without signs/symptoms of physical distress    Intensity THRR unchanged      Progression   Progression Continue to progress workloads to maintain intensity without signs/symptoms of physical distress.    Average METs 2.86      Resistance Training   Training Prescription Yes    Weight 4 lb    Reps 10-15      Treadmill   MPH 2.8    Grade 1.5    Minutes 15    METs 3.72      Recumbant Elliptical   Level 1    RPM 50    Minutes 15    METs 2             Nutrition:  Target Goals: Understanding of nutrition guidelines, daily intake of sodium 1500mg , cholesterol 200mg , calories 30% from fat and 7% or less from saturated fats, daily to have 5 or more servings of fruits and vegetables.  Education: All About Nutrition: -Group instruction provided by verbal, written material, interactive activities, discussions, models, and posters to present general guidelines for heart healthy nutrition including fat, fiber, MyPlate, the role of sodium in heart healthy nutrition, utilization of the nutrition label, and utilization of this knowledge for meal planning. Follow up email sent as well. Written material given at graduation.   Biometrics:  Pre Biometrics - 01/13/21 1028       Pre Biometrics   Height 5' 8.4" (1.737 m)    Weight 172 lb 4.8 oz (78.2 kg)    BMI (Calculated) 25.9    Single Leg Stand 30 seconds              Nutrition Therapy Plan and Nutrition Goals:  Nutrition Therapy & Goals - 01/24/21 0719       Nutrition Therapy   Diet Heart healthy, low Na, T2DM    Drug/Food Interactions Statins/Certain Fruits    Protein (specify units) 60g    Fiber 30 grams    Whole Grain Foods 3 servings  Saturated Fats 12 max. grams    Fruits and Vegetables 8 servings/day    Sodium 1.5 grams      Personal Nutrition Goals   Nutrition Goal ST: add nuts/seeds or peanut butter to breakfast or a snack, add greek yogurt with fruit as  snack if not eating lunch LT: meet protein needs regularly, continue with current changes    Comments 75 y.o. 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 He reports no previous issues with his heart that he knows of until the stent placement. He reports eating much of the same since his heart event, but he has limited freid foods and biscuits. He will cook a lot at home and has since he was young. He wakes up at around 4-5am (he likes 5-6 hours). He reports he used to eat a biscuit on the way to work, now he will have a cup of coffee (1 splenda and 1 pack of cream) and bowl of cereal (cheerios and rice krispies woth non-fat milk)  L: may or may not eat lunch. A sandwich or peanut butter crackers (ham or bologna or [place]  on whole wheat bread) D: He likes to have homemade soup in the winter (vegetable or beef soup). Drinks: unsweetened iced tea, water. Broiled flounder out 1x/week, salmon or meat cooked at home on grill/ He reports not using salt and he will use corn oil for frying (seldom), now he uses it for airfrying. PYP: 73. Discussed heart healthy eating, reviewed T2DM MNT.      Intervention Plan   Intervention Prescribe, educate and counsel regarding individualized specific dietary modifications aiming towards targeted core components such as weight, hypertension, lipid management, diabetes, heart failure and other comorbidities.    Expected Outcomes Short Term Goal: Understand basic principles of dietary content, such as calories, fat, sodium, cholesterol and nutrients.;Short Term Goal: A plan has been developed with personal nutrition goals set during dietitian appointment.;Long Term Goal: Adherence to prescribed nutrition plan.             Nutrition Assessments:  MEDIFICTS Score Key: ?70 Need to make dietary changes  40-70 Heart Healthy Diet ? 40 Therapeutic Level Cholesterol Diet  Flowsheet Row Cardiac Rehab from 01/13/2021  in [facility] Cardiac and Pulmonary Rehab  Picture Your Plate Total Score on Admission 73      Picture Your Plate Scores: [id number] Unhealthy dietary pattern with much room for improvement. 41-50 Dietary pattern unlikely to meet recommendations for good health and room for improvement. 51-60 More healthful dietary pattern, with some room for improvement.  >60 Healthy dietary pattern, although there may be some specific behaviors that could be improved.    Nutrition Goals Re-Evaluation:   Nutrition Goals Discharge (Final Nutrition Goals Re-Evaluation):   Psychosocial: Target Goals: Acknowledge presence or absence of significant depression and/or stress, maximize coping skills, provide positive support system. Participant is able to verbalize types and ability to use techniques and skills needed for reducing stress and depression.   Education: Stress, Anxiety, and Depression - Group verbal and visual presentation to define topics covered.  Reviews how body is impacted by stress, anxiety, and depression.  Also discusses healthy ways to reduce stress and to treat/manage anxiety and depression.  Written material given at graduation. Flowsheet Row Cardiac Rehab from 01/26/2021 in [facility] Cardiac and Pulmonary Rehab  Education need identified 01/13/21  Date 01/19/21  Educator [facility]  Instruction Review Code 1- Verbalizes Understanding  Education: Sleep Hygiene -Provides group verbal and written instruction about how sleep can affect your health.  Define sleep hygiene, discuss sleep cycles and impact of sleep habits. Review good sleep hygiene tips.    Initial Review & Psychosocial Screening:  Initial Psych Review & Screening - 01/03/21 1113       Initial Review   Current issues with None Identified      Family Dynamics   Good Support System? Yes    Comments Diago lost his wife two years ago and is support system is his sister in Sports coach. He reports no issues with his mental health.      Barriers    Psychosocial barriers to participate in program The patient should benefit from training in stress management and relaxation.      Screening Interventions   Interventions Encouraged to exercise;Provide feedback about the scores to participant;To provide support and resources with identified psychosocial needs    Expected Outcomes Short Term goal: Utilizing psychosocial counselor, staff and physician to assist with identification of specific Stressors or current issues interfering with healing process. Setting desired goal for each stressor or current issue identified.;Long Term Goal: Stressors or current issues are controlled or eliminated.;Short Term goal: Identification and review with participant of any Quality of Life or Depression concerns found by scoring the questionnaire.;Long Term goal: The participant improves quality of Life and PHQ9 Scores as seen by post scores and/or verbalization of changes             Quality of Life Scores:   Quality of Life - 01/13/21 1028       Quality of Life   Select Quality of Life      Quality of Life Scores   Health/Function Pre 27.27 %    Socioeconomic Pre 24.5 %    Psych/Spiritual Pre 29.64 %    Family Pre 15 %   wife passed two years ago and no kids   GLOBAL Pre 25.36 %            Scores of 19 and below usually indicate a poorer quality of life in these areas.  A difference of  2-3 points is a clinically meaningful difference.  A difference of 2-3 points in the total score of the Quality of Life Index has been associated with significant improvement in overall quality of life, self-image, physical symptoms, and general health in studies assessing change in quality of life.  PHQ-9: Recent Review Flowsheet Data     Depression screen [facility] 2/9 01/13/2021   Decreased Interest 0   Down, Depressed, Hopeless 0   PHQ - 2 Score 0   Altered sleeping 0   Tired, decreased energy 0   Change in appetite 0   Feeling bad or failure about yourself   0   Trouble concentrating 0   Moving slowly or fidgety/restless 0   Suicidal thoughts 0   PHQ-9 Score 0   Difficult doing work/chores Not difficult at all      Interpretation of Total Score  Total Score Depression Severity:  1-4 = Minimal depression, 5-9 = Mild depression, 10-14 = Moderate depression, 15-19 = Moderately severe depression, 20-27 = Severe depression   Psychosocial Evaluation and Intervention:  Psychosocial Evaluation - 01/03/21 1114       Psychosocial Evaluation & Interventions   Interventions Encouraged to exercise with the program and follow exercise prescription;Relaxation education;Stress management education    Comments Amman lost his wife two years ago and is support system is his  sister in law. He reports no issues with his mental health.    Expected Outcomes Short: Start [facility] to help with mood. Long: Maintain a healthy mental state    Continue Psychosocial Services  Follow up required by staff             Psychosocial Re-Evaluation:   Psychosocial Discharge (Final Psychosocial Re-Evaluation):   Vocational Rehabilitation: Provide vocational rehab assistance to qualifying candidates.   Vocational Rehab Evaluation & Intervention:   Education: Education Goals: Education classes will be provided on a variety of topics geared toward better understanding of heart health and risk factor modification. Participant will state understanding/return demonstration of topics presented as noted by education test scores.  Learning Barriers/Preferences:  Learning Barriers/Preferences - 01/03/21 1112       Learning Barriers/Preferences   Learning Barriers None    Learning Preferences None             General Cardiac Education Topics:  AED/CPR: - Group verbal and written instruction with the use of models to demonstrate the basic use of the AED with the basic ABC's of resuscitation.   Anatomy and Cardiac Procedures: - Group verbal and visual  presentation and models provide information about basic cardiac anatomy and function. Reviews the testing methods done to diagnose heart disease and the outcomes of the test results. Describes the treatment choices: Medical Management, Angioplasty, or Coronary Bypass Surgery for treating various heart conditions including Myocardial Infarction, Angina, Valve Disease, and Cardiac Arrhythmias.  Written material given at graduation. Flowsheet Row Cardiac Rehab from 01/26/2021 in [facility] Cardiac and Pulmonary Rehab  Education need identified 01/13/21       Medication Safety: - Group verbal and visual instruction to review commonly prescribed medications for heart and lung disease. Reviews the medication, class of the drug, and side effects. Includes the steps to properly store meds and maintain the prescription regimen.  Written material given at graduation.   Intimacy: - Group verbal instruction through game format to discuss how heart and lung disease can affect sexual intimacy. Written material given at graduation..   Know Your Numbers and Heart Failure: - Group verbal and visual instruction to discuss disease risk factors for cardiac and pulmonary disease and treatment options.  Reviews associated critical values for Overweight/Obesity, Hypertension, Cholesterol, and Diabetes.  Discusses basics of heart failure: signs/symptoms and treatments.  Introduces Heart Failure Zone chart for action plan for heart failure.  Written material given at graduation.   Infection Prevention: - Provides verbal and written material to individual with discussion of infection control including proper hand washing and proper equipment cleaning during exercise session. Flowsheet Row Cardiac Rehab from 01/26/2021 in [facility] Cardiac and Pulmonary Rehab  Date 01/03/21  Educator jh  Instruction Review Code 1- Verbalizes Understanding       Falls Prevention: - Provides verbal and written material to individual with  discussion of falls prevention and safety. Flowsheet Row Cardiac Rehab from 01/26/2021 in [facility] Cardiac and Pulmonary Rehab  Date 01/03/21  Educator jh  Instruction Review Code 1- Verbalizes Understanding       Other: -Provides group and verbal instruction on various topics (see comments)   Knowledge Questionnaire Score:  Knowledge Questionnaire Score - 01/13/21 1029       Knowledge Questionnaire Score   Pre Score 22/26             Core Components/Risk Factors/Patient Goals at Admission:  Personal Goals and Risk Factors at Admission - 01/13/21 1029  Core Components/Risk Factors/Patient Goals on Admission    Weight Management Yes;Weight Maintenance    Intervention Weight Management: Develop a combined nutrition and exercise program designed to reach desired caloric intake, while maintaining appropriate intake of nutrient and fiber, sodium and fats, and appropriate energy expenditure required for the weight goal.;Weight Management: Provide education and appropriate resources to help participant work on and attain dietary goals.;Weight Management/Obesity: Establish reasonable short term and long term weight goals.    Admit Weight 172 lb 4.8 oz (78.2 kg)    Goal Weight: Short Term 172 lb (78 kg)    Goal Weight: Long Term 172 lb (78 kg)    Expected Outcomes Short Term: Continue to assess and modify interventions until short term weight is achieved;Long Term: Adherence to nutrition and physical activity/exercise program aimed toward attainment of established weight goal;Weight Maintenance: Understanding of the daily nutrition guidelines, which includes 25-35% calories from fat, 7% or less cal from saturated fats, less than 200mg  cholesterol, less than 1.5gm of sodium, & 5 or more servings of fruits and vegetables daily;Understanding recommendations for meals to include 15-35% energy as protein, 25-35% energy from fat, 35-60% energy from carbohydrates, less than 200mg  of dietary  cholesterol, 20-35 gm of total fiber daily;Understanding of distribution of calorie intake throughout the day with the consumption of 4-5 meals/snacks    Diabetes Yes    Intervention Provide education about signs/symptoms and action to take for hypo/hyperglycemia.;Provide education about proper nutrition, including hydration, and aerobic/resistive exercise prescription along with prescribed medications to achieve blood glucose in normal ranges: Fasting glucose 65-99 mg/dL    Expected Outcomes Short Term: Participant verbalizes understanding of the signs/symptoms and immediate care of hyper/hypoglycemia, proper foot care and importance of medication, aerobic/resistive exercise and nutrition plan for blood glucose control.;Long Term: Attainment of HbA1C < 7%.    Hypertension Yes    Intervention Provide education on lifestyle modifcations including regular physical activity/exercise, weight management, moderate sodium restriction and increased consumption of fresh fruit, vegetables, and low fat dairy, alcohol moderation, and smoking cessation.;Monitor prescription use compliance.    Expected Outcomes Short Term: Continued assessment and intervention until BP is < 140/90mm HG in hypertensive participants. < 130/76mm HG in hypertensive participants with diabetes, heart failure or chronic kidney disease.;Long Term: Maintenance of blood pressure at goal levels.             Education:Diabetes - Individual verbal and written instruction to review signs/symptoms of diabetes, desired ranges of glucose level fasting, after meals and with exercise. Acknowledge that pre and post exercise glucose checks will be done for 3 sessions at entry of program. [place] from 01/26/2021 in [facility] Cardiac and Pulmonary Rehab  Date 01/03/21  Educator jh  Instruction Review Code 1- Verbalizes Understanding       Core Components/Risk Factors/Patient Goals Review:    Core Components/Risk Factors/Patient  Goals at Discharge (Final Review):    ITP Comments:  ITP Comments     Row Name 01/03/21 1110 01/13/21 1025 01/17/21 0742 01/24/21 0815 02/02/21 0726   ITP Comments Virtual Visit completed. Patient informed on EP and RD appointment and 6 Minute walk test. Patient also informed of patient health questionnaires on My Chart. Patient Verbalizes understanding. Visit diagnosis can be found in [facility] 12/09/2020. Completed 6MWT and gym orientation. Initial ITP created and sent for review to Dr. Emily Filbert, Medical Director. First full day of exercise!  Patient was oriented to gym and equipment including functions, settings, policies, and procedures.  Patient's individual  exercise prescription and treatment plan were reviewed.  All starting workloads were established based on the results of the 6 minute walk test done at initial orientation visit.  The plan for exercise progression was also introduced and progression will be customized based on patient's performance and goals. Completed initial RD consultation 30 Day review completed. Medical Director ITP review done, changes made as directed, and signed approval by Medical Director.            Comments:

## 2021-02-04 ENCOUNTER — Other Ambulatory Visit: Payer: Self-pay

## 2021-02-04 ENCOUNTER — Encounter: Payer: Medicare HMO | Admitting: *Deleted

## 2021-02-04 DIAGNOSIS — Z955 Presence of coronary angioplasty implant and graft: Secondary | ICD-10-CM | POA: Diagnosis not present

## 2021-02-04 NOTE — Progress Notes (Signed)
Daily Session Note  Patient Details  Name: Thomas Rice MRN: [id number] Date of Birth: 1945/05/26 Referring Provider:   Flowsheet Row Cardiac Rehab from 01/13/2021 in [facility] Cardiac and Pulmonary Rehab  Referring Provider Donnelly Angelica MD       Encounter Date: 02/04/2021  Check In:  Session Check In - 02/04/21 0820       Check-In   Supervising physician immediately available to respond to emergencies See telemetry face sheet for immediately available ER MD    Location [facility]-Cardiac & Pulmonary Rehab    Staff Present Alberteen Sam, MA, RCEP, CCRP, CCET;Joseph [place], [place];Heath Lark, RN, BSN, CCRP    Virtual Visit No    Medication changes reported     No    Fall or balance concerns reported    No    Warm-up and Cool-down Performed on first and last piece of equipment    Resistance Training Performed Yes    VAD Patient? No    PAD/SET Patient? No      Pain Assessment   Currently in Pain? No/denies                Social History   Tobacco Use  Smoking Status Never  Smokeless Tobacco Never    Goals Met:  Independence with exercise equipment Exercise tolerated well No report of concerns or symptoms today  Goals Unmet:  Not Applicable  Comments: Pt able to follow exercise prescription today without complaint.  Will continue to monitor for progression.    Dr. Emily Filbert is Medical Director for [place].  Dr. Ottie Glazier is Medical Director for [facility] Pulmonary Rehabilitation.

## 2021-02-09 ENCOUNTER — Other Ambulatory Visit: Payer: Self-pay

## 2021-02-09 ENCOUNTER — Encounter: Payer: Medicare HMO | Attending: Cardiology

## 2021-02-09 DIAGNOSIS — Z955 Presence of coronary angioplasty implant and graft: Secondary | ICD-10-CM | POA: Insufficient documentation

## 2021-02-09 NOTE — Progress Notes (Signed)
Daily Session Note  Patient Details  Name: Thomas Rice MRN: [id number] Date of Birth: Oct 19, 1945 Referring Provider:   Flowsheet Row Cardiac Rehab from 01/13/2021 in [facility] Cardiac and Pulmonary Rehab  Referring Provider Donnelly Angelica MD       Encounter Date: 02/09/2021  Check In:  Session Check In - 02/09/21 0743       Check-In   Supervising physician immediately available to respond to emergencies See telemetry face sheet for immediately available ER MD    Location [facility]-Cardiac & Pulmonary Rehab    Staff Present Birdie Sons, MPA, Elveria Rising, BA, ACSM CEP, Exercise Physiologist;Joseph Tessie Fass, [place]    Virtual Visit No    Medication changes reported     No    Fall or balance concerns reported    No    Warm-up and Cool-down Performed on first and last piece of equipment    Resistance Training Performed Yes    VAD Patient? No    PAD/SET Patient? No      Pain Assessment   Currently in Pain? No/denies                Social History   Tobacco Use  Smoking Status Never  Smokeless Tobacco Never    Goals Met:  Independence with exercise equipment Exercise tolerated well No report of concerns or symptoms today Strength training completed today  Goals Unmet:  Not Applicable  Comments: Pt able to follow exercise prescription today without complaint.  Will continue to monitor for progression.    Dr. Emily Filbert is Medical Director for [employer].  Dr. Ottie Glazier is Medical Director for [facility] Pulmonary Rehabilitation.

## 2021-02-11 ENCOUNTER — Other Ambulatory Visit: Payer: Self-pay

## 2021-02-11 ENCOUNTER — Encounter: Payer: Medicare HMO | Admitting: *Deleted

## 2021-02-11 DIAGNOSIS — Z955 Presence of coronary angioplasty implant and graft: Secondary | ICD-10-CM

## 2021-02-11 NOTE — Progress Notes (Signed)
Daily Session Note  Patient Details  Name: Thomas Rice MRN: [id number] Date of Birth: 1945-04-28 Referring Provider:   Flowsheet Row Cardiac Rehab from 01/13/2021 in [facility] Cardiac and Pulmonary Rehab  Referring Provider Donnelly Angelica MD       Encounter Date: 02/11/2021  Check In:  Session Check In - 02/11/21 0838       Check-In   Supervising physician immediately available to respond to emergencies See telemetry face sheet for immediately available ER MD    Location [facility]-Cardiac & Pulmonary Rehab    Staff Present Alberteen Sam, MA, RCEP, CCRP, CCET;Joseph [place], [place];Heath Lark, RN, BSN, CCRP    Virtual Visit No    Medication changes reported     No    Fall or balance concerns reported    No    Warm-up and Cool-down Performed on first and last piece of equipment    Resistance Training Performed Yes    VAD Patient? No    PAD/SET Patient? No      Pain Assessment   Currently in Pain? No/denies                Social History   Tobacco Use  Smoking Status Never  Smokeless Tobacco Never    Goals Met:  Independence with exercise equipment Exercise tolerated well No report of concerns or symptoms today  Goals Unmet:  Not Applicable  Comments: Pt able to follow exercise prescription today without complaint.  Will continue to monitor for progression.    Dr. Emily Filbert is Medical Director for [place].  Dr. Ottie Glazier is Medical Director for [facility] Pulmonary Rehabilitation.

## 2021-02-14 ENCOUNTER — Other Ambulatory Visit: Payer: Self-pay

## 2021-02-14 ENCOUNTER — Encounter: Payer: Medicare HMO | Admitting: *Deleted

## 2021-02-14 DIAGNOSIS — Z955 Presence of coronary angioplasty implant and graft: Secondary | ICD-10-CM

## 2021-02-14 NOTE — Progress Notes (Signed)
Daily Session Note  Patient Details  Name: EVERETTE MALL MRN: [id number] Date of Birth: 1945/09/22 Referring Provider:   Flowsheet Row Cardiac Rehab from 01/13/2021 in [facility] Cardiac and Pulmonary Rehab  Referring Provider Donnelly Angelica MD       Encounter Date: 02/14/2021  Check In:  Session Check In - 02/14/21 0805       Check-In   Supervising physician immediately available to respond to emergencies See telemetry face sheet for immediately available ER MD    Location [facility]-Cardiac & Pulmonary Rehab    Staff Present Earlean Shawl, BS, ACSM CEP, Exercise Physiologist;Joseph [place], [place];Heath Lark, RN, BSN, CCRP    Virtual Visit No    Medication changes reported     No    Fall or balance concerns reported    No    Warm-up and Cool-down Performed on first and last piece of equipment    Resistance Training Performed Yes    VAD Patient? No    PAD/SET Patient? No      Pain Assessment   Currently in Pain? No/denies                Social History   Tobacco Use  Smoking Status Never  Smokeless Tobacco Never    Goals Met:  Independence with exercise equipment Exercise tolerated well No report of concerns or symptoms today  Goals Unmet:  Not Applicable  Comments: Pt able to follow exercise prescription today without complaint.  Will continue to monitor for progression.    Dr. Emily Filbert is Medical Director for [facility].  Dr. Ottie Glazier is Medical Director for [facility] Pulmonary Rehabilitation.

## 2021-02-16 ENCOUNTER — Other Ambulatory Visit: Payer: Self-pay

## 2021-02-16 DIAGNOSIS — Z955 Presence of coronary angioplasty implant and graft: Secondary | ICD-10-CM

## 2021-02-16 NOTE — Progress Notes (Signed)
Daily Session Note  Patient Details  Name: Thomas Rice MRN: [id number] Date of Birth: 05-29-1945 Referring Provider:   Flowsheet Row Cardiac Rehab from 01/13/2021 in [facility] Cardiac and Pulmonary Rehab  Referring Provider Donnelly Angelica MD       Encounter Date: 02/16/2021  Check In:  Session Check In - 02/16/21 0744       Check-In   Supervising physician immediately available to respond to emergencies See telemetry face sheet for immediately available ER MD    Location [facility]-Cardiac & Pulmonary Rehab    Staff Present Birdie Sons, MPA, RN;Joseph [place], RCP,RRT,BSRT;Jessica [place], MA, RCEP, CCRP, CCET    Virtual Visit No    Medication changes reported     No    Fall or balance concerns reported    No    Warm-up and Cool-down Performed on first and last piece of equipment    Resistance Training Performed Yes    VAD Patient? No    PAD/SET Patient? No      Pain Assessment   Currently in Pain? No/denies                Social History   Tobacco Use  Smoking Status Never  Smokeless Tobacco Never    Goals Met:  Independence with exercise equipment Exercise tolerated well No report of concerns or symptoms today Strength training completed today  Goals Unmet:  Not Applicable  Comments: Pt able to follow exercise prescription today without complaint.  Will continue to monitor for progression.    Dr. Emily Filbert is Medical Director for [place].  Dr. Ottie Glazier is Medical Director for [facility] Pulmonary Rehabilitation.

## 2021-02-18 ENCOUNTER — Encounter: Payer: Medicare HMO | Admitting: *Deleted

## 2021-02-18 ENCOUNTER — Other Ambulatory Visit: Payer: Self-pay

## 2021-02-18 DIAGNOSIS — Z955 Presence of coronary angioplasty implant and graft: Secondary | ICD-10-CM

## 2021-02-18 NOTE — Progress Notes (Signed)
Daily Session Note  Patient Details  Name: Thomas Rice MRN: [id number] Date of Birth: November 06, 1945 Referring Provider:   Flowsheet Row Cardiac Rehab from 01/13/2021 in [facility] Cardiac and Pulmonary Rehab  Referring Provider Donnelly Angelica MD       Encounter Date: 02/18/2021  Check In:  Session Check In - 02/18/21 0833       Check-In   Supervising physician immediately available to respond to emergencies See telemetry face sheet for immediately available ER MD    Location [facility]-Cardiac & Pulmonary Rehab    Staff Present Alberteen Sam, MA, RCEP, CCRP, CCET;Joseph [place], [place];Heath Lark, RN, BSN, CCRP    Virtual Visit No    Medication changes reported     No    Fall or balance concerns reported    No    Warm-up and Cool-down Performed on first and last piece of equipment    Resistance Training Performed Yes    VAD Patient? No    PAD/SET Patient? No      Pain Assessment   Currently in Pain? No/denies                Social History   Tobacco Use  Smoking Status Never  Smokeless Tobacco Never    Goals Met:  Independence with exercise equipment Exercise tolerated well No report of concerns or symptoms today  Goals Unmet:  Not Applicable  Comments: Pt able to follow exercise prescription today without complaint.  Will continue to monitor for progression.    Dr. Emily Filbert is Medical Director for [place].  Dr. Ottie Glazier is Medical Director for [facility] Pulmonary Rehabilitation.

## 2021-02-21 ENCOUNTER — Other Ambulatory Visit: Payer: Self-pay

## 2021-02-21 ENCOUNTER — Encounter: Payer: Medicare HMO | Admitting: *Deleted

## 2021-02-21 DIAGNOSIS — Z955 Presence of coronary angioplasty implant and graft: Secondary | ICD-10-CM | POA: Diagnosis not present

## 2021-02-21 NOTE — Progress Notes (Signed)
Daily Session Note  Patient Details  Name: Thomas Rice MRN: [id number] Date of Birth: 1946-02-05 Referring Provider:   Flowsheet Row Cardiac Rehab from 01/13/2021 in [facility] Cardiac and Pulmonary Rehab  Referring Provider Donnelly Angelica MD       Encounter Date: 02/21/2021  Check In:  Session Check In - 02/21/21 0803       Check-In   Supervising physician immediately available to respond to emergencies See telemetry face sheet for immediately available ER MD    Location [facility]-Cardiac & Pulmonary Rehab    Staff Present Heath Lark, RN, BSN, CCRP;Joseph [place], RCP,RRT,BSRT;Kelly [place], [place], ACSM CEP, Exercise Physiologist    Virtual Visit No    Medication changes reported     No    Fall or balance concerns reported    No    Warm-up and Cool-down Performed on first and last piece of equipment    Resistance Training Performed Yes    VAD Patient? No    PAD/SET Patient? No      Pain Assessment   Currently in Pain? No/denies                Social History   Tobacco Use  Smoking Status Never  Smokeless Tobacco Never    Goals Met:  Independence with exercise equipment Exercise tolerated well No report of concerns or symptoms today  Goals Unmet:  Not Applicable  Comments: Pt able to follow exercise prescription today without complaint.  Will continue to monitor for progression.    Dr. Emily Filbert is Medical Director for [place].  Dr. Ottie Glazier is Medical Director for [facility] Pulmonary Rehabilitation.

## 2021-02-23 ENCOUNTER — Other Ambulatory Visit: Payer: Self-pay

## 2021-02-23 DIAGNOSIS — Z955 Presence of coronary angioplasty implant and graft: Secondary | ICD-10-CM | POA: Diagnosis not present

## 2021-02-23 NOTE — Progress Notes (Signed)
Daily Session Note  Patient Details  Name: Thomas Rice MRN: [id number] Date of Birth: 14-Dec-1945 Referring Provider:   Flowsheet Row Cardiac Rehab from 01/13/2021 in [facility] Cardiac and Pulmonary Rehab  Referring Provider Donnelly Angelica MD       Encounter Date: 02/23/2021  Check In:  Session Check In - 02/23/21 0740       Check-In   Supervising physician immediately available to respond to emergencies See telemetry face sheet for immediately available ER MD    Location [facility]-Cardiac & Pulmonary Rehab    Staff Present Birdie Sons, MPA, RN;Joseph Tessie Fass, RCP,RRT,BSRT;Amanda Oletta Darter, BA, ACSM CEP, Exercise Physiologist    Virtual Visit No    Medication changes reported     No    Fall or balance concerns reported    No    Warm-up and Cool-down Performed on first and last piece of equipment    Resistance Training Performed Yes    VAD Patient? No    PAD/SET Patient? No      Pain Assessment   Currently in Pain? No/denies                Social History   Tobacco Use  Smoking Status Never  Smokeless Tobacco Never    Goals Met:  Independence with exercise equipment Exercise tolerated well No report of concerns or symptoms today Strength training completed today  Goals Unmet:  Not Applicable  Comments: Pt able to follow exercise prescription today without complaint.  Will continue to monitor for progression.    Dr. Emily Filbert is Medical Director for [place].  Dr. Ottie Glazier is Medical Director for [facility] Pulmonary Rehabilitation.

## 2021-02-25 ENCOUNTER — Encounter: Payer: Medicare HMO | Admitting: *Deleted

## 2021-02-25 ENCOUNTER — Other Ambulatory Visit: Payer: Self-pay

## 2021-02-25 DIAGNOSIS — Z955 Presence of coronary angioplasty implant and graft: Secondary | ICD-10-CM | POA: Diagnosis not present

## 2021-02-25 NOTE — Progress Notes (Signed)
Daily Session Note  Patient Details  Name: Thomas Rice MRN: [id number] Date of Birth: 1945-04-25 Referring Provider:   Flowsheet Row Cardiac Rehab from 01/13/2021 in [facility] Cardiac and Pulmonary Rehab  Referring Provider Donnelly Angelica MD       Encounter Date: 02/25/2021  Check In:  Session Check In - 02/25/21 0816       Check-In   Supervising physician immediately available to respond to emergencies See telemetry face sheet for immediately available ER MD    Location [facility]-Cardiac & Pulmonary Rehab    Staff Present Heath Lark, RN, BSN, CCRP;Joseph [place], RCP,RRT,BSRT;Jessica [place], [place], [place], CCRP, CCET    Virtual Visit No    Medication changes reported     No    Fall or balance concerns reported    No    Warm-up and Cool-down Performed on first and last piece of equipment    Resistance Training Performed Yes    VAD Patient? No    PAD/SET Patient? No      Pain Assessment   Currently in Pain? No/denies                Social History   Tobacco Use  Smoking Status Never  Smokeless Tobacco Never    Goals Met:  Independence with exercise equipment Exercise tolerated well No report of concerns or symptoms today  Goals Unmet:  Not Applicable  Comments: Pt able to follow exercise prescription today without complaint.  Will continue to monitor for progression.    Dr. Emily Filbert is Medical Director for [place].  Dr. Ottie Glazier is Medical Director for [facility] Pulmonary Rehabilitation.

## 2021-02-28 ENCOUNTER — Encounter: Payer: Medicare HMO | Admitting: *Deleted

## 2021-02-28 ENCOUNTER — Other Ambulatory Visit: Payer: Self-pay

## 2021-02-28 DIAGNOSIS — Z955 Presence of coronary angioplasty implant and graft: Secondary | ICD-10-CM

## 2021-02-28 NOTE — Progress Notes (Signed)
Daily Session Note  Patient Details  Name: SHEY BARTMESS MRN: [id number] Date of Birth: 1945/06/17 Referring Provider:   Flowsheet Row Cardiac Rehab from 01/13/2021 in [facility] Cardiac and Pulmonary Rehab  Referring Provider Donnelly Angelica MD       Encounter Date: 02/28/2021  Check In:  Session Check In - 02/28/21 0809       Check-In   Supervising physician immediately available to respond to emergencies See telemetry face sheet for immediately available ER MD    Location [facility]-Cardiac & Pulmonary Rehab    Staff Present Heath Lark, RN, BSN, Laveda Norman, BS, ACSM CEP, Exercise Physiologist;Joseph [place], [place]    Virtual Visit No    Medication changes reported     No    Fall or balance concerns reported    No    Warm-up and Cool-down Performed on first and last piece of equipment    Resistance Training Performed Yes    VAD Patient? No    PAD/SET Patient? No      Pain Assessment   Currently in Pain? No/denies                Social History   Tobacco Use  Smoking Status Never  Smokeless Tobacco Never    Goals Met:  Independence with exercise equipment Exercise tolerated well No report of concerns or symptoms today  Goals Unmet:  Not Applicable  Comments: Pt able to follow exercise prescription today without complaint.  Will continue to monitor for progression.    Dr. Emily Filbert is Medical Director for [place].  Dr. Ottie Glazier is Medical Director for [facility] Pulmonary Rehabilitation.

## 2021-03-02 ENCOUNTER — Encounter: Payer: Self-pay | Admitting: *Deleted

## 2021-03-02 ENCOUNTER — Other Ambulatory Visit: Payer: Self-pay

## 2021-03-02 DIAGNOSIS — Z955 Presence of coronary angioplasty implant and graft: Secondary | ICD-10-CM | POA: Diagnosis not present

## 2021-03-02 NOTE — Progress Notes (Signed)
Cardiac Individual Treatment Plan  Patient Details  Name: Thomas Rice MRN: [id number] Date of Birth: Apr 06, 1945 Referring Provider:   Flowsheet Row Cardiac Rehab from 01/13/2021 in [facility] Cardiac and Pulmonary Rehab  Referring Provider Donnelly Angelica MD       Initial Encounter Date:  Flowsheet Row Cardiac Rehab from 01/13/2021 in [facility] Cardiac and Pulmonary Rehab  Date 01/13/21       Visit Diagnosis: Status post coronary artery stent placement  Patient's Home Medications on Admission:  Current Outpatient Medications:    amLODipine (NORVASC) 5 MG tablet, Take 1 tablet (5 mg total) by mouth daily., Disp: 30 tablet, Rfl: 0   aspirin EC 81 MG tablet, Take 81 mg by mouth at bedtime., Disp: , Rfl:    atorvastatin (LIPITOR) 40 MG tablet, Take 40 mg by mouth daily., Disp: , Rfl:    clopidogrel (PLAVIX) 75 MG tablet, Take 1 tablet (75 mg total) by mouth daily., Disp: 30 tablet, Rfl: 2   ergocalciferol (VITAMIN D2) 1.25 MG (50000 UT) capsule, Take 50,000 Units by mouth every 30 (thirty) days., Disp: , Rfl:    glipiZIDE (GLUCOTROL XL) 5 MG 24 hr tablet, Take 10 mg by mouth daily., Disp: , Rfl:    latanoprost (XALATAN) 0.005 % ophthalmic solution, Place 1 drop into both eyes at bedtime., Disp: , Rfl:    linagliptin (TRADJENTA) 5 MG TABS tablet, Take 5 mg by mouth daily., Disp: , Rfl:    linagliptin (TRADJENTA) 5 MG TABS tablet, Take 1 tablet by mouth daily. (Patient not taking: Reported on 01/03/2021), Disp: , Rfl:    metoprolol succinate (TOPROL-XL) 25 MG 24 hr tablet, Take 25 mg by mouth daily., Disp: , Rfl:    nitroGLYCERIN (NITROSTAT) 0.4 MG SL tablet, Place 0.4 mg under the tongue every 5 (five) minutes x 3 doses as needed for chest pain., Disp: , Rfl:    triamcinolone cream (KENALOG) 0.1 %, Apply topically 2 (two) times daily., Disp: , Rfl:    vitamin B-12 (CYANOCOBALAMIN) 500 MCG tablet, Take 1 tablet (500 mcg total) by mouth daily., Disp: 30 tablet, Rfl: 2  Past Medical History: Past  Medical History:  Diagnosis Date   Anemia    Bursitis    Chronic kidney disease    STAGE 3   Diabetes mellitus without complication (HCC)    Hyperkalemia    Hypertension     Tobacco Use: Social History   Tobacco Use  Smoking Status Never  Smokeless Tobacco Never    Labs: Recent Review Flowsheet Data     Labs for ITP Cardiac and Pulmonary Rehab Latest Ref Rng & Units 12/01/2020 12/02/2020   Cholestrol 0 - 200 mg/dL - 81   LDLCALC 0 - 99 mg/dL - 32   HDL >40 mg/dL - 33(L)   Trlycerides <150 mg/dL - 82   Hemoglobin A1c 4.8 - 5.6 % 7.6(H) -        Exercise Target Goals: Exercise Program Goal: Individual exercise prescription set using results from initial 6 min walk test and THRR while considering  patients activity barriers and safety.   Exercise Prescription Goal: Initial exercise prescription builds to 30-45 minutes a day of aerobic activity, 2-3 days per week.  Home exercise guidelines will be given to patient during program as part of exercise prescription that the participant will acknowledge.   Education: Aerobic Exercise: - Group verbal and visual presentation on the components of exercise prescription. Introduces F.I.T.T principle from ACSM for exercise prescriptions.  Reviews F.I.T.T. principles of  aerobic exercise including progression. Written material given at graduation. Flowsheet Row Cardiac Rehab from 03/02/2021 in [facility] Cardiac and Pulmonary Rehab  Education need identified 01/13/21  Date 02/02/21  Educator [facility]  Instruction Review Code 1- Verbalizes Understanding       Education: Resistance Exercise: - Group verbal and visual presentation on the components of exercise prescription. Introduces F.I.T.T principle from ACSM for exercise prescriptions  Reviews F.I.T.T. principles of resistance exercise including progression. Written material given at graduation. Flowsheet Row Cardiac Rehab from 03/02/2021 in [facility] Cardiac and Pulmonary Rehab  Date 02/09/21   Educator [facility]  Instruction Review Code 1- Verbalizes Understanding        Education: Exercise & Equipment Safety: - Individual verbal instruction and demonstration of equipment use and safety with use of the equipment. Flowsheet Row Cardiac Rehab from 03/02/2021 in [facility] Cardiac and Pulmonary Rehab  Date 01/03/21  Educator jh  Instruction Review Code 1- Verbalizes Understanding       Education: Exercise Physiology & General Exercise Guidelines: - Group verbal and written instruction with models to review the exercise physiology of the cardiovascular system and associated critical values. Provides general exercise guidelines with specific guidelines to those with heart or lung disease.  Flowsheet Row Cardiac Rehab from 03/02/2021 in [facility] Cardiac and Pulmonary Rehab  Date 01/26/21  Educator [facility]  Instruction Review Code 1- Verbalizes Understanding       Education: Flexibility, Balance, Mind/Body Relaxation: - Group verbal and visual presentation with interactive activity on the components of exercise prescription. Introduces F.I.T.T principle from ACSM for exercise prescriptions. Reviews F.I.T.T. principles of flexibility and balance exercise training including progression. Also discusses the mind body connection.  Reviews various relaxation techniques to help reduce and manage stress (i.e. Deep breathing, progressive muscle relaxation, and visualization). Balance handout provided to take home. Written material given at graduation. Flowsheet Row Cardiac Rehab from 03/02/2021 in [facility] Cardiac and Pulmonary Rehab  Date 02/16/21  Educator [facility]  Instruction Review Code 1- Verbalizes Understanding       Activity Barriers & Risk Stratification:  Activity Barriers & Cardiac Risk Stratification - 01/13/21 1026       Activity Barriers & Cardiac Risk Stratification   Activity Barriers Muscular Weakness;Deconditioning;Other (comment);Joint Problems    Comments left shoulder limited ROM     Cardiac Risk Stratification Moderate             6 Minute Walk:  6 Minute Walk     Row Name 01/13/21 1025         6 Minute Walk   Phase Initial     Distance 1290 feet     Walk Time 6 minutes     # of Rest Breaks 0     MPH 2.44     METS 3.12     RPE 9     VO2 Peak 10.91     Symptoms No     Resting HR 69 bpm     Resting BP 146/60     Resting Oxygen Saturation  99 %     Exercise Oxygen Saturation  during 6 min walk 98 %     Max Ex. HR 109 bpm     Max Ex. BP 168/74     2 Minute Post BP 136/70              Oxygen Initial Assessment:   Oxygen Re-Evaluation:   Oxygen Discharge (Final Oxygen Re-Evaluation):   Initial Exercise Prescription:  Initial Exercise Prescription - 01/13/21 1000  Date of Initial Exercise RX and Referring Provider   Date 01/13/21    Referring Provider Donnelly Angelica MD      Oxygen   Maintain Oxygen Saturation 88% or higher      Treadmill   MPH 2.4    Grade 1    Minutes 15    METs 3.17      NuStep   Level 3    SPM 80    Minutes 15    METs 3      Recumbant Elliptical   Level 1    RPM 50    Minutes 15    METs 2      Prescription Details   Frequency (times per week) 3    Duration Progress to 30 minutes of continuous aerobic without signs/symptoms of physical distress      Intensity   THRR 40-80% of Max Heartrate 99-130    Ratings of Perceived Exertion 11-13    Perceived Dyspnea 0-4      Progression   Progression Continue to progress workloads to maintain intensity without signs/symptoms of physical distress.      Resistance Training   Training Prescription Yes    Weight 4 lb    Reps 10-15             Perform Capillary Blood Glucose checks as needed.  Exercise Prescription Changes:   Exercise Prescription Changes     Row Name 01/13/21 1000 01/25/21 1200 02/08/21 0800 02/22/21 1200       Response to Exercise   Blood Pressure (Admit) 146/60 152/80 142/64 136/76    Blood Pressure (Exercise) 168/74  152/64 128/58 --    Blood Pressure (Exit) 136/70 138/72 124/74 122/60    Heart Rate (Admit) 68 bpm 79 bpm 71 bpm 84 bpm    Heart Rate (Exercise) 109 bpm 114 bpm 110 bpm 104 bpm    Heart Rate (Exit) 77 bpm -- 81 bpm 89 bpm    Oxygen Saturation (Admit) 99 % -- -- --    Oxygen Saturation (Exercise) 98 % -- -- --    Rating of Perceived Exertion (Exercise) 9 13 11 12     Symptoms none none none none    Comments walk test results 4th day -- --    Duration -- Progress to 30 minutes of  aerobic without signs/symptoms of physical distress Continue with 30 min of aerobic exercise without signs/symptoms of physical distress. Continue with 30 min of aerobic exercise without signs/symptoms of physical distress.    Intensity -- THRR unchanged THRR unchanged THRR unchanged      Progression   Progression -- Continue to progress workloads to maintain intensity without signs/symptoms of physical distress. Continue to progress workloads to maintain intensity without signs/symptoms of physical distress. Continue to progress workloads to maintain intensity without signs/symptoms of physical distress.    Average METs -- 2.86 3.13 3.35      Resistance Training   Training Prescription -- Yes Yes Yes    Weight -- 4 lb 4 lb 4 lb    Reps -- 10-15 10-15 10-15      Treadmill   MPH -- 2.8 2.6 2.8    Grade -- 1.5 1 1.5    Minutes -- 15 15 15     METs -- 3.72 3.35 3.72      NuStep   Level -- -- 4 5    Minutes -- -- 15 15    METs -- -- 3.3 3      Recumbant  Elliptical   Level -- 1 2 --    RPM -- 50 -- --    Minutes -- 15 15 --    METs -- 2 2.1 --             Exercise Comments:   Exercise Comments     Row Name 01/17/21 0743           Exercise Comments First full day of exercise!  Patient was oriented to gym and equipment including functions, settings, policies, and procedures.  Patient's individual exercise prescription and treatment plan were reviewed.  All starting workloads were established based  on the results of the 6 minute walk test done at initial orientation visit.  The plan for exercise progression was also introduced and progression will be customized based on patient's performance and goals.                Exercise Goals and Review:   Exercise Goals     Row Name 01/13/21 1027             Exercise Goals   Increase Physical Activity Yes       Intervention Develop an individualized exercise prescription for aerobic and resistive training based on initial evaluation findings, risk stratification, comorbidities and participant's personal goals.;Provide advice, education, support and counseling about physical activity/exercise needs.       Expected Outcomes Short Term: Attend rehab on a regular basis to increase amount of physical activity.;Long Term: Add in home exercise to make exercise part of routine and to increase amount of physical activity.;Long Term: Exercising regularly at least 3-5 days a week.       Increase Strength and Stamina Yes       Intervention Provide advice, education, support and counseling about physical activity/exercise needs.;Develop an individualized exercise prescription for aerobic and resistive training based on initial evaluation findings, risk stratification, comorbidities and participant's personal goals.       Expected Outcomes Short Term: Increase workloads from initial exercise prescription for resistance, speed, and METs.;Short Term: Perform resistance training exercises routinely during rehab and add in resistance training at home;Long Term: Improve cardiorespiratory fitness, muscular endurance and strength as measured by increased METs and functional capacity (6MWT)       Able to understand and use rate of perceived exertion (RPE) scale Yes       Intervention Provide education and explanation on how to use RPE scale       Expected Outcomes Long Term:  Able to use RPE to guide intensity level when exercising independently;Short Term: Able to  use RPE daily in rehab to express subjective intensity level       Able to understand and use Dyspnea scale Yes       Intervention Provide education and explanation on how to use Dyspnea scale       Expected Outcomes Short Term: Able to use Dyspnea scale daily in rehab to express subjective sense of shortness of breath during exertion;Long Term: Able to use Dyspnea scale to guide intensity level when exercising independently       Knowledge and understanding of Target Heart Rate Range (THRR) Yes       Intervention Provide education and explanation of THRR including how the numbers were predicted and where they are located for reference       Expected Outcomes Short Term: Able to state/look up THRR;Short Term: Able to use daily as guideline for intensity in rehab;Long Term: Able to use THRR to govern intensity  when exercising independently       Able to check pulse independently Yes       Intervention Provide education and demonstration on how to check pulse in carotid and radial arteries.;Review the importance of being able to check your own pulse for safety during independent exercise       Expected Outcomes Short Term: Able to explain why pulse checking is important during independent exercise;Long Term: Able to check pulse independently and accurately       Understanding of Exercise Prescription Yes       Intervention Provide education, explanation, and written materials on patient's individual exercise prescription       Expected Outcomes Short Term: Able to explain program exercise prescription;Long Term: Able to explain home exercise prescription to exercise independently                Exercise Goals Re-Evaluation :  Exercise Goals Re-Evaluation     Row Name 01/17/21 0743 01/25/21 1251 02/08/21 0834 02/22/21 1236 02/22/21 1239     Exercise Goal Re-Evaluation   Exercise Goals Review Increase Physical Activity;Able to understand and use rate of perceived exertion (RPE) scale;Knowledge  and understanding of Target Heart Rate Range (THRR);Understanding of Exercise Prescription;Able to understand and use Dyspnea scale;Able to check pulse independently;Increase Strength and Stamina Increase Physical Activity;Increase Strength and Stamina Increase Physical Activity;Increase Strength and Stamina Increase Physical Activity;Increase Strength and Stamina Increase Physical Activity;Increase Strength and Stamina   Comments Reviewed RPE and dyspnea scales, THR and program prescription with pt today.  Pt voiced understanding and was given a copy of goals to take home. Thomas Rice is doing well and has increased TM to 2.8 mph and 1.5 incline.  Staff will monitor progress. Thomas Rice continues to do well in rehab. His treadmill speeds and incline vary each session and he would benefit from keep consistency and gradually increasing his loads. He has increased to level 2 on the REL. Will continue to monitor. -- Thomas Rice atends consistently and works in [employer] range.  He has gradually increased levels on all machines.  Staff will encourage trying  5 lb for strength.   Expected Outcomes Short: Use RPE daily to regulate intensity. Long: Follow program prescription in THR. Short: attend consistently Long:  improve overall stamina Short: Keep treadmill loads consistent and gradually increase Long: Continue to increase overall MET level -- Short: try 5 lb Long:  continue to build stamina            Discharge Exercise Prescription (Final Exercise Prescription Changes):  Exercise Prescription Changes - 02/22/21 1200       Response to Exercise   Blood Pressure (Admit) 136/76    Blood Pressure (Exit) 122/60    Heart Rate (Admit) 84 bpm    Heart Rate (Exercise) 104 bpm    Heart Rate (Exit) 89 bpm    Rating of Perceived Exertion (Exercise) 12    Symptoms none    Duration Continue with 30 min of aerobic exercise without signs/symptoms of physical distress.    Intensity THRR unchanged      Progression   Progression  Continue to progress workloads to maintain intensity without signs/symptoms of physical distress.    Average METs 3.35      Resistance Training   Training Prescription Yes    Weight 4 lb    Reps 10-15      Treadmill   MPH 2.8    Grade 1.5    Minutes 15    METs 3.72  NuStep   Level 5    Minutes 15    METs 3             Nutrition:  Target Goals: Understanding of nutrition guidelines, daily intake of sodium '1500mg'$ , cholesterol '200mg'$ , calories 30% from fat and 7% or less from saturated fats, daily to have 5 or more servings of fruits and vegetables.  Education: All About Nutrition: -Group instruction provided by verbal, written material, interactive activities, discussions, models, and posters to present general guidelines for heart healthy nutrition including fat, fiber, MyPlate, the role of sodium in heart healthy nutrition, utilization of the nutrition label, and utilization of this knowledge for meal planning. Follow up email sent as well. Written material given at graduation. Flowsheet Row Cardiac Rehab from 03/02/2021 in [facility] Cardiac and Pulmonary Rehab  Date 03/02/21  Educator [facility]  Instruction Review Code 1- Verbalizes Understanding       Biometrics:  Pre Biometrics - 01/13/21 1028       Pre Biometrics   Height 5' 8.4" (1.737 m)    Weight 172 lb 4.8 oz (78.2 kg)    BMI (Calculated) 25.9    Single Leg Stand 30 seconds              Nutrition Therapy Plan and Nutrition Goals:  Nutrition Therapy & Goals - 01/24/21 0719       Nutrition Therapy   Diet Heart healthy, low Na, T2DM    Drug/Food Interactions Statins/Certain Fruits    Protein (specify units) 60g    Fiber 30 grams    Whole Grain Foods 3 servings    Saturated Fats 12 max. grams    Fruits and Vegetables 8 servings/day    Sodium 1.5 grams      Personal Nutrition Goals   Nutrition Goal ST: add nuts/seeds or peanut butter to breakfast or a snack, add greek yogurt with fruit as snack if not  eating lunch LT: meet protein needs regularly, continue with current changes    Comments 76 y.o. 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 He reports no previous issues with his heart that he knows of until the stent placement. He reports eating much of the same since his heart event, but he has limited freid foods and biscuits. He will cook a lot at home and has since he was young. He wakes up at around 4-5am (he likes 5-6 hours). He reports he used to eat a biscuit on the way to work, now he will have a cup of coffee (1 splenda and 1 pack of cream) and bowl of cereal (cheerios and rice krispies woth non-fat milk)  L: may or may not eat lunch. A sandwich or peanut butter crackers (ham or bologna or [place]  on whole wheat bread) D: He likes to have homemade soup in the winter (vegetable or beef soup). Drinks: unsweetened iced tea, water. Broiled flounder out 1x/week, salmon or meat cooked at home on grill/ He reports not using salt and he will use corn oil for frying (seldom), now he uses it for airfrying. PYP: 73. Discussed heart healthy eating, reviewed T2DM MNT.      Intervention Plan   Intervention Prescribe, educate and counsel regarding individualized specific dietary modifications aiming towards targeted core components such as weight, hypertension, lipid management, diabetes, heart failure and other comorbidities.    Expected Outcomes Short Term Goal: Understand basic principles of dietary content, such as calories,  fat, sodium, cholesterol and nutrients.;Short Term Goal: A plan has been developed with personal nutrition goals set during dietitian appointment.;Long Term Goal: Adherence to prescribed nutrition plan.             Nutrition Assessments:  MEDIFICTS Score Key: ?70 Need to make dietary changes  40-70 Heart Healthy Diet ? 40 Therapeutic Level Cholesterol Diet  Flowsheet Row Cardiac Rehab from 01/13/2021 in [facility]  Cardiac and Pulmonary Rehab  Picture Your Plate Total Score on Admission 73      Picture Your Plate Scores: <33 Unhealthy dietary pattern with much room for improvement. 41-50 Dietary pattern unlikely to meet recommendations for good health and room for improvement. 51-60 More healthful dietary pattern, with some room for improvement.  >60 Healthy dietary pattern, although there may be some specific behaviors that could be improved.    Nutrition Goals Re-Evaluation:  Nutrition Goals Re-Evaluation     Thomas Rice Name 02/11/21 0818             Goals   Current Weight 169 lb (76.7 kg)       Comment Thomas Rice has been eating at home more and not going out. He eats Cereal, oatmeal, grits. He feels like his diet is good and wants to maintain his weight.       Expected Outcome Short: continue eating at home. Long: maintain a diet that pertains to him independently.                Nutrition Goals Discharge (Final Nutrition Goals Re-Evaluation):  Nutrition Goals Re-Evaluation - 02/11/21 0818       Goals   Current Weight 169 lb (76.7 kg)    Comment Thomas Rice has been eating at home more and not going out. He eats Cereal, oatmeal, grits. He feels like his diet is good and wants to maintain his weight.    Expected Outcome Short: continue eating at home. Long: maintain a diet that pertains to him independently.             Psychosocial: Target Goals: Acknowledge presence or absence of significant depression and/or stress, maximize coping skills, provide positive support system. Participant is able to verbalize types and ability to use techniques and skills needed for reducing stress and depression.   Education: Stress, Anxiety, and Depression - Group verbal and visual presentation to define topics covered.  Reviews how body is impacted by stress, anxiety, and depression.  Also discusses healthy ways to reduce stress and to treat/manage anxiety and depression.  Written material given at  graduation. Flowsheet Row Cardiac Rehab from 03/02/2021 in [facility] Cardiac and Pulmonary Rehab  Education need identified 01/13/21  Date 01/19/21  Educator [facility]  Instruction Review Code 1- [employer] Understanding       Education: Sleep Hygiene -Provides group verbal and written instruction about how sleep can affect your health.  Define sleep hygiene, discuss sleep cycles and impact of sleep habits. Review good sleep hygiene tips.    Initial Review & Psychosocial Screening:  Initial Psych Review & Screening - 01/03/21 1113       Initial Review   Current issues with None Identified      Family Dynamics   Good Support System? Yes    Comments Thomas Rice lost his wife two years ago and is support system is his sister in Sports coach. He reports no issues with his mental health.      Barriers   Psychosocial barriers to participate in program The patient should benefit from training in stress management  and relaxation.      Screening Interventions   Interventions Encouraged to exercise;Provide feedback about the scores to participant;To provide support and resources with identified psychosocial needs    Expected Outcomes Short Term goal: Utilizing psychosocial counselor, staff and physician to assist with identification of specific Stressors or current issues interfering with healing process. Setting desired goal for each stressor or current issue identified.;Long Term Goal: Stressors or current issues are controlled or eliminated.;Short Term goal: Identification and review with participant of any Quality of Life or Depression concerns found by scoring the questionnaire.;Long Term goal: The participant improves quality of Life and PHQ9 Scores as seen by post scores and/or verbalization of changes             Quality of Life Scores:   Quality of Life - 01/13/21 1028       Quality of Life   Select Quality of Life      Quality of Life Scores   Health/Function Pre 27.27 %    Socioeconomic Pre 24.5 %     Psych/Spiritual Pre 29.64 %    Family Pre 15 %   wife passed two years ago and no kids   GLOBAL Pre 25.36 %            Scores of 19 and below usually indicate a poorer quality of life in these areas.  A difference of  2-3 points is a clinically meaningful difference.  A difference of 2-3 points in the total score of the Quality of Life Index has been associated with significant improvement in overall quality of life, self-image, physical symptoms, and general health in studies assessing change in quality of life.  PHQ-9: Recent Review Flowsheet Data     Depression screen [facility] [facility] 2/9 01/13/2021   Decreased Interest 0   Down, Depressed, Hopeless 0   PHQ - 2 Score 0   Altered sleeping 0   Tired, decreased energy 0   Change in appetite 0   Feeling bad or failure about yourself  0   Trouble concentrating 0   Moving slowly or fidgety/restless 0   Suicidal thoughts 0   PHQ-9 Score 0   Difficult doing work/chores Not difficult at all      Interpretation of Total Score  Total Score Depression Severity:  1-4 = Minimal depression, 5-9 = Mild depression, 10-14 = Moderate depression, 15-19 = Moderately severe depression, 20-27 = Severe depression   Psychosocial Evaluation and Intervention:  Psychosocial Evaluation - 01/03/21 1114       Psychosocial Evaluation & Interventions   Interventions Encouraged to exercise with the program and follow exercise prescription;Relaxation education;Stress management education    Comments Thomas Rice lost his wife two years ago and is support system is his sister in Sports coach. He reports no issues with his mental health.    Expected Outcomes Short: Start [facility] to help with mood. Long: Maintain a healthy mental state    Continue Psychosocial Services  Follow up required by staff             Psychosocial Re-Evaluation:  Psychosocial Re-Evaluation     [place] Name 02/11/21 0818             Psychosocial Re-Evaluation   Current issues with None  Identified       Comments Patient reports no issues with their current mental states, sleep, stress, depression or anxiety. Will follow up with patient in a few weeks for any changes.       Expected Outcomes Short:  Continue to exercise regularly to support mental health and notify staff of any changes. Long: maintain mental health and well being through teaching of rehab or prescribed medications independently.       Interventions Encouraged to attend Cardiac Rehabilitation for the exercise       Continue Psychosocial Services  Follow up required by staff                Psychosocial Discharge (Final Psychosocial Re-Evaluation):  Psychosocial Re-Evaluation - 02/11/21 0818       Psychosocial Re-Evaluation   Current issues with None Identified    Comments Patient reports no issues with their current mental states, sleep, stress, depression or anxiety. Will follow up with patient in a few weeks for any changes.    Expected Outcomes Short: Continue to exercise regularly to support mental health and notify staff of any changes. Long: maintain mental health and well being through teaching of rehab or prescribed medications independently.    Interventions Encouraged to attend Cardiac Rehabilitation for the exercise    Continue Psychosocial Services  Follow up required by staff             Vocational Rehabilitation: Provide vocational rehab assistance to qualifying candidates.   Vocational Rehab Evaluation & Intervention:   Education: Education Goals: Education classes will be provided on a variety of topics geared toward better understanding of heart health and risk factor modification. Participant will state understanding/return demonstration of topics presented as noted by education test scores.  Learning Barriers/Preferences:  Learning Barriers/Preferences - 01/03/21 1112       Learning Barriers/Preferences   Learning Barriers None    Learning Preferences None              General Cardiac Education Topics:  AED/CPR: - Group verbal and written instruction with the use of models to demonstrate the basic use of the AED with the basic ABC's of resuscitation.   Anatomy and Cardiac Procedures: - Group verbal and visual presentation and models provide information about basic cardiac anatomy and function. Reviews the testing methods done to diagnose heart disease and the outcomes of the test results. Describes the treatment choices: Medical Management, Angioplasty, or Coronary Bypass Surgery for treating various heart conditions including Myocardial Infarction, Angina, Valve Disease, and Cardiac Arrhythmias.  Written material given at graduation. Flowsheet Row Cardiac Rehab from 03/02/2021 in [facility] Cardiac and Pulmonary Rehab  Education need identified 01/13/21  Date 02/09/21  Educator SB  Instruction Review Code 1- Verbalizes Understanding       Medication Safety: - Group verbal and visual instruction to review commonly prescribed medications for heart and lung disease. Reviews the medication, class of the drug, and side effects. Includes the steps to properly store meds and maintain the prescription regimen.  Written material given at graduation. Flowsheet Row Cardiac Rehab from 03/02/2021 in [facility] Cardiac and Pulmonary Rehab  Date 02/23/21  Educator SB  Instruction Review Code 1- Verbalizes Understanding       Intimacy: - Group verbal instruction through game format to discuss how heart and lung disease can affect sexual intimacy. Written material given at graduation.. Flowsheet Row Cardiac Rehab from 03/02/2021 in [facility] Cardiac and Pulmonary Rehab  Date 02/02/21  Educator [facility]  Instruction Review Code 1- Verbalizes Understanding       Know Your Numbers and Heart Failure: - Group verbal and visual instruction to discuss disease risk factors for cardiac and pulmonary disease and treatment options.  Reviews associated critical values for  Overweight/Obesity, Hypertension,  Cholesterol, and Diabetes.  Discusses basics of heart failure: signs/symptoms and treatments.  Introduces Heart Failure Zone chart for action plan for heart failure.  Written material given at graduation.   Infection Prevention: - Provides verbal and written material to individual with discussion of infection control including proper hand washing and proper equipment cleaning during exercise session. Flowsheet Row Cardiac Rehab from 03/02/2021 in [facility] Cardiac and Pulmonary Rehab  Date 01/03/21  Educator jh  Instruction Review Code 1- Verbalizes Understanding       Falls Prevention: - Provides verbal and written material to individual with discussion of falls prevention and safety. Flowsheet Row Cardiac Rehab from 03/02/2021 in [facility] Cardiac and Pulmonary Rehab  Date 01/03/21  Educator jh  Instruction Review Code 1- Verbalizes Understanding       Other: -Provides group and verbal instruction on various topics (see comments)   Knowledge Questionnaire Score:  Knowledge Questionnaire Score - 01/13/21 1029       Knowledge Questionnaire Score   Pre Score 22/26             Core Components/Risk Factors/Patient Goals at Admission:  Personal Goals and Risk Factors at Admission - 01/13/21 1029       Core Components/Risk Factors/Patient Goals on Admission    Weight Management Yes;Weight Maintenance    Intervention Weight Management: Develop a combined nutrition and exercise program designed to reach desired caloric intake, while maintaining appropriate intake of nutrient and fiber, sodium and fats, and appropriate energy expenditure required for the weight goal.;Weight Management: Provide education and appropriate resources to help participant work on and attain dietary goals.;Weight Management/Obesity: Establish reasonable short term and long term weight goals.    Admit Weight 172 lb 4.8 oz (78.2 kg)    Goal Weight: Short Term 172 lb (78 kg)     Goal Weight: Long Term 172 lb (78 kg)    Expected Outcomes Short Term: Continue to assess and modify interventions until short term weight is achieved;Long Term: Adherence to nutrition and physical activity/exercise program aimed toward attainment of established weight goal;Weight Maintenance: Understanding of the daily nutrition guidelines, which includes 25-35% calories from fat, 7% or less cal from saturated fats, less than $RemoveB'200mg'mMrefcdP$  cholesterol, less than 1.5gm of sodium, & 5 or more servings of fruits and vegetables daily;Understanding recommendations for meals to include 15-35% energy as protein, 25-35% energy from fat, 35-60% energy from carbohydrates, less than $RemoveB'200mg'BVeOqcqz$  of dietary cholesterol, 20-35 gm of total fiber daily;Understanding of distribution of calorie intake throughout the day with the consumption of 4-5 meals/snacks    Diabetes Yes    Intervention Provide education about signs/symptoms and action to take for hypo/hyperglycemia.;Provide education about proper nutrition, including hydration, and aerobic/resistive exercise prescription along with prescribed medications to achieve blood glucose in normal ranges: Fasting glucose 65-99 mg/dL    Expected Outcomes Short Term: Participant verbalizes understanding of the signs/symptoms and immediate care of hyper/hypoglycemia, proper foot care and importance of medication, aerobic/resistive exercise and nutrition plan for blood glucose control.;Long Term: Attainment of HbA1C < 7%.    Hypertension Yes    Intervention Provide education on lifestyle modifcations including regular physical activity/exercise, weight management, moderate sodium restriction and increased consumption of fresh fruit, vegetables, and low fat dairy, alcohol moderation, and smoking cessation.;Monitor prescription use compliance.    Expected Outcomes Short Term: Continued assessment and intervention until BP is < 140/53mm HG in hypertensive participants. < 130/26mm HG in hypertensive  participants with diabetes, heart failure or chronic kidney disease.;Long Term: Maintenance of  blood pressure at goal levels.             Education:Diabetes - Individual verbal and written instruction to review signs/symptoms of diabetes, desired ranges of glucose level fasting, after meals and with exercise. Acknowledge that pre and post exercise glucose checks will be done for 3 sessions at entry of program. [place] from 03/02/2021 in [facility] Cardiac and Pulmonary Rehab  Date 01/03/21  Educator jh  Instruction Review Code 1- Verbalizes Understanding       Core Components/Risk Factors/Patient Goals Review:   Goals and Risk Factor Review     Row Name 02/11/21 0820             Core Components/Risk Factors/Patient Goals Review   Personal Goals Review Weight Management/Obesity;Hypertension       Review Thomas Rice states he wants to maintain his weight. He has been doing well with his exercise and diet. He has been checking his blood pressure at home and nmonitoring his heart rate.       Expected Outcomes Short: keep checking blood pressures at home and heart rate. Long: maintain blood pressures and vital signs at home independently.                Core Components/Risk Factors/Patient Goals at Discharge (Final Review):   Goals and Risk Factor Review - 02/11/21 0820       Core Components/Risk Factors/Patient Goals Review   Personal Goals Review Weight Management/Obesity;Hypertension    Review Thomas Rice states he wants to maintain his weight. He has been doing well with his exercise and diet. He has been checking his blood pressure at home and nmonitoring his heart rate.    Expected Outcomes Short: keep checking blood pressures at home and heart rate. Long: maintain blood pressures and vital signs at home independently.             ITP Comments:  ITP Comments     Row Name 01/03/21 1110 01/13/21 1025 01/17/21 0742 01/24/21 0815 02/02/21 0726   ITP Comments  Virtual Visit completed. Patient informed on EP and RD appointment and 6 Minute walk test. Patient also informed of patient health questionnaires on My Chart. Patient Verbalizes understanding. Visit diagnosis can be found in [facility] 12/09/2020. Completed 6MWT and gym orientation. Initial ITP created and sent for review to Dr. Emily Filbert, Medical Director. First full day of exercise!  Patient was oriented to gym and equipment including functions, settings, policies, and procedures.  Patient's individual exercise prescription and treatment plan were reviewed.  All starting workloads were established based on the results of the 6 minute walk test done at initial orientation visit.  The plan for exercise progression was also introduced and progression will be customized based on patient's performance and goals. Completed initial RD consultation 30 Day review completed. Medical Director ITP review done, changes made as directed, and signed approval by Medical Director.    [place] Name 03/02/21 0856           ITP Comments 30 Day review completed. Medical Director ITP review done, changes made as directed, and signed approval by Medical Director.                Comments:

## 2021-03-02 NOTE — Progress Notes (Signed)
Daily Session Note  Patient Details  Name: Thomas Rice MRN: [id number] Date of Birth: 1945-02-22 Referring Provider:   Flowsheet Row Cardiac Rehab from 01/13/2021 in [facility] Cardiac and Pulmonary Rehab  Referring Provider Donnelly Angelica MD       Encounter Date: 03/02/2021  Check In:  Session Check In - 03/02/21 0743       Check-In   Supervising physician immediately available to respond to emergencies See telemetry face sheet for immediately available ER MD    Location [facility]-Cardiac & Pulmonary Rehab    Staff Present Birdie Sons, MPA, RN;Jessica Luan Pulling, MA, RCEP, CCRP, CCET;Joseph [place], [place]    Virtual Visit No    Medication changes reported     No    Fall or balance concerns reported    No    Warm-up and Cool-down Performed on first and last piece of equipment    Resistance Training Performed Yes    VAD Patient? No    PAD/SET Patient? No      Pain Assessment   Currently in Pain? No/denies                Social History   Tobacco Use  Smoking Status Never  Smokeless Tobacco Never    Goals Met:  Independence with exercise equipment Exercise tolerated well No report of concerns or symptoms today Strength training completed today  Goals Unmet:  Not Applicable  Comments: Pt able to follow exercise prescription today without complaint.  Will continue to monitor for progression.    Dr. Emily Filbert is Medical Director for [place].  Dr. Ottie Glazier is Medical Director for [facility] Pulmonary Rehabilitation.

## 2021-03-04 ENCOUNTER — Other Ambulatory Visit: Payer: Self-pay

## 2021-03-04 ENCOUNTER — Encounter: Payer: Medicare HMO | Admitting: *Deleted

## 2021-03-04 DIAGNOSIS — Z955 Presence of coronary angioplasty implant and graft: Secondary | ICD-10-CM | POA: Diagnosis not present

## 2021-03-04 NOTE — Progress Notes (Signed)
Daily Session Note  Patient Details  Name: WASIL WOLKE MRN: [id number] Date of Birth: 29-Jan-1946 Referring Provider:   Flowsheet Row Cardiac Rehab from 01/13/2021 in [facility] Cardiac and Pulmonary Rehab  Referring Provider Donnelly Angelica MD       Encounter Date: 03/04/2021  Check In:  Session Check In - 03/04/21 1003       Check-In   Supervising physician immediately available to respond to emergencies See telemetry face sheet for immediately available ER MD    Location [facility]-Cardiac & Pulmonary Rehab    Staff Present Hope Budds, RDN, LDN;Joseph [place], [place];Heath Lark, RN, BSN, CCRP    Virtual Visit No    Medication changes reported     No    Fall or balance concerns reported    No    Warm-up and Cool-down Performed on first and last piece of equipment    Resistance Training Performed Yes    VAD Patient? No    PAD/SET Patient? No      Pain Assessment   Currently in Pain? No/denies                Social History   Tobacco Use  Smoking Status Never  Smokeless Tobacco Never    Goals Met:  Independence with exercise equipment Exercise tolerated well No report of concerns or symptoms today  Goals Unmet:  Not Applicable  Comments: Pt able to follow exercise prescription today without complaint.  Will continue to monitor for progression.    Dr. Emily Filbert is Medical Director for [place].  Dr. Ottie Glazier is Medical Director for [facility] Pulmonary Rehabilitation.

## 2021-03-07 ENCOUNTER — Encounter: Payer: Medicare HMO | Admitting: *Deleted

## 2021-03-07 ENCOUNTER — Other Ambulatory Visit: Payer: Self-pay

## 2021-03-07 DIAGNOSIS — Z955 Presence of coronary angioplasty implant and graft: Secondary | ICD-10-CM | POA: Diagnosis not present

## 2021-03-07 NOTE — Progress Notes (Signed)
Daily Session Note  Patient Details  Name: Thomas Rice MRN: [id number] Date of Birth: [birth year] Referring Provider:   Flowsheet Row Cardiac Rehab from 01/13/2021 in [facility] Cardiac and Pulmonary Rehab  Referring Provider Donnelly Angelica MD       Encounter Date: 03/07/2021  Check In:  Session Check In - 03/07/21 4696       Check-In   Supervising physician immediately available to respond to emergencies See telemetry face sheet for immediately available ER MD    Location [facility]-Cardiac & Pulmonary Rehab    Staff Present Carson Myrtle, BS, RRT, CPFT;Kelly Amedeo Plenty, BS, ACSM CEP, Exercise Physiologist;Joseph [place], [place];Heath Lark, RN, BSN, CCRP    Virtual Visit No    Medication changes reported     No    Fall or balance concerns reported    No    Warm-up and Cool-down Performed on first and last piece of equipment    Resistance Training Performed Yes    VAD Patient? No    PAD/SET Patient? No      Pain Assessment   Currently in Pain? No/denies                Social History   Tobacco Use  Smoking Status Never  Smokeless Tobacco Never    Goals Met:  Independence with exercise equipment Exercise tolerated well No report of concerns or symptoms today  Goals Unmet:  Not Applicable  Comments: Pt able to follow exercise prescription today without complaint.  Will continue to monitor for progression.    Dr. Emily Filbert is Medical Director for [place].  Dr. Ottie Glazier is Medical Director for [facility] Pulmonary Rehabilitation.

## 2021-03-09 ENCOUNTER — Encounter: Payer: Medicare HMO | Attending: Cardiology

## 2021-03-09 ENCOUNTER — Other Ambulatory Visit: Payer: Self-pay

## 2021-03-09 DIAGNOSIS — Z955 Presence of coronary angioplasty implant and graft: Secondary | ICD-10-CM | POA: Insufficient documentation

## 2021-03-09 NOTE — Progress Notes (Signed)
Daily Session Note  Patient Details  Name: Thomas Rice MRN: [id number] Date of Birth: 1946/01/12 Referring Provider:   Flowsheet Row Cardiac Rehab from 01/13/2021 in [facility] Cardiac and Pulmonary Rehab  Referring Provider Donnelly Angelica MD       Encounter Date: 03/09/2021  Check In:  Session Check In - 03/09/21 0744       Check-In   Supervising physician immediately available to respond to emergencies See telemetry face sheet for immediately available ER MD    Location [facility]-Cardiac & Pulmonary Rehab    Staff Present Birdie Sons, MPA, RN;Joseph Tessie Fass, RCP,RRT,BSRT;Amanda Oletta Darter, BA, ACSM CEP, Exercise Physiologist    Virtual Visit No    Medication changes reported     No    Fall or balance concerns reported    No    Warm-up and Cool-down Performed on first and last piece of equipment    Resistance Training Performed Yes    VAD Patient? No    PAD/SET Patient? No      Pain Assessment   Currently in Pain? No/denies                Social History   Tobacco Use  Smoking Status Never  Smokeless Tobacco Never    Goals Met:  Independence with exercise equipment Exercise tolerated well No report of concerns or symptoms today Strength training completed today  Goals Unmet:  Not Applicable  Comments: Pt able to follow exercise prescription today without complaint.  Will continue to monitor for progression.  Reviewed home exercise with pt today.  Pt plans to walk and use [employer] at home for exercise.  Reviewed THR, pulse, RPE, sign and symptoms, pulse oximetery and when to call 911 or MD.  Also discussed weather considerations and indoor options.  Pt voiced understanding.   Dr. Emily Filbert is Medical Director for [place].  Dr. Ottie Glazier is Medical Director for [facility] Pulmonary Rehabilitation.

## 2021-03-11 ENCOUNTER — Encounter: Payer: Medicare HMO | Admitting: *Deleted

## 2021-03-11 ENCOUNTER — Other Ambulatory Visit: Payer: Self-pay

## 2021-03-11 VITALS — Ht 68.4 in | Wt 169.1 lb

## 2021-03-11 DIAGNOSIS — Z955 Presence of coronary angioplasty implant and graft: Secondary | ICD-10-CM | POA: Diagnosis not present

## 2021-03-11 NOTE — Progress Notes (Signed)
Daily Session Note  Patient Details  Name: Thomas Rice MRN: [id number] Date of Birth: 03/20/1945 Referring Provider:   Flowsheet Row Cardiac Rehab from 01/13/2021 in [facility] Cardiac and Pulmonary Rehab  Referring Provider Donnelly Angelica MD       Encounter Date: 03/11/2021  Check In:  Session Check In - 03/11/21 0805       Check-In   Supervising physician immediately available to respond to emergencies See telemetry face sheet for immediately available ER MD    Location [facility]-Cardiac & Pulmonary Rehab    Staff Present Heath Lark, RN, BSN, CCRP;Joseph [place], RCP,RRT,BSRT;Jessica [place], [place], [place], CCRP, CCET    Virtual Visit No    Medication changes reported     No    Fall or balance concerns reported    No    Warm-up and Cool-down Performed on first and last piece of equipment    Resistance Training Performed Yes    VAD Patient? No    PAD/SET Patient? No      Pain Assessment   Currently in Pain? No/denies                Social History   Tobacco Use  Smoking Status Never  Smokeless Tobacco Never    Goals Met:  Independence with exercise equipment Exercise tolerated well No report of concerns or symptoms today  Goals Unmet:  Not Applicable  Comments: Pt able to follow exercise prescription today without complaint.  Will continue to monitor for progression.   [place] Name 01/13/21 1025 03/11/21 0815       6 Minute Walk   Phase Initial Discharge    Distance 1290 feet 1545 feet    Distance % Change -- 43 %    Distance Feet Change -- 555 ft    Walk Time 6 minutes 6 minutes    # of Rest Breaks 0 0    MPH 2.44 2.92    METS 3.12 3.74    RPE 9 12    VO2 Peak 10.91 13.1    Symptoms No No    Resting HR 69 bpm 82 bpm    Resting BP 146/60 124/68    Resting Oxygen Saturation  99 % 97 %    Exercise Oxygen Saturation  during 6 min walk 98 % 98 %    Max Ex. HR 109 bpm 126 bpm    Max Ex. BP 168/74 162/64    2 Minute Post BP 136/70 --               Dr. Emily Filbert is Medical Director for [place].  Dr. Ottie Glazier is Medical Director for [facility] Pulmonary Rehabilitation.

## 2021-03-14 ENCOUNTER — Encounter: Payer: Medicare HMO | Admitting: *Deleted

## 2021-03-14 ENCOUNTER — Other Ambulatory Visit: Payer: Self-pay

## 2021-03-14 DIAGNOSIS — Z955 Presence of coronary angioplasty implant and graft: Secondary | ICD-10-CM | POA: Diagnosis not present

## 2021-03-14 NOTE — Progress Notes (Signed)
Daily Session Note  Patient Details  Name: Thomas Rice MRN: [id number] Date of Birth: August 28, 1945 Referring Provider:   Flowsheet Row Cardiac Rehab from 01/13/2021 in [facility] Cardiac and Pulmonary Rehab  Referring Provider Donnelly Angelica MD       Encounter Date: 03/14/2021  Check In:  Session Check In - 03/14/21 0817       Check-In   Supervising physician immediately available to respond to emergencies See telemetry face sheet for immediately available ER MD    Location [facility]-Cardiac & Pulmonary Rehab    Staff Present Heath Lark, RN, BSN, CCRP;Joseph [place], RCP,RRT,BSRT;Kelly [place], [place], ACSM CEP, Exercise Physiologist    Virtual Visit No    Medication changes reported     No    Fall or balance concerns reported    No    Warm-up and Cool-down Performed on first and last piece of equipment    Resistance Training Performed Yes    VAD Patient? No    PAD/SET Patient? No      Pain Assessment   Currently in Pain? No/denies                Social History   Tobacco Use  Smoking Status Never  Smokeless Tobacco Never    Goals Met:  Independence with exercise equipment Exercise tolerated well No report of concerns or symptoms today  Goals Unmet:  Not Applicable  Comments: Pt able to follow exercise prescription today without complaint.  Will continue to monitor for progression.    Dr. Emily Filbert is Medical Director for [place].  Dr. Ottie Glazier is Medical Director for [facility] Pulmonary Rehabilitation.

## 2021-03-16 ENCOUNTER — Other Ambulatory Visit: Payer: Self-pay

## 2021-03-16 DIAGNOSIS — Z955 Presence of coronary angioplasty implant and graft: Secondary | ICD-10-CM | POA: Diagnosis not present

## 2021-03-16 NOTE — Progress Notes (Signed)
Daily Session Note  Patient Details  Name: Thomas Rice MRN: [id number] Date of Birth: October 11, 1945 Referring Provider:   Flowsheet Row Cardiac Rehab from 01/13/2021 in [facility] Cardiac and Pulmonary Rehab  Referring Provider Donnelly Angelica MD       Encounter Date: 03/16/2021  Check In:  Session Check In - 03/16/21 0744       Check-In   Supervising physician immediately available to respond to emergencies See telemetry face sheet for immediately available ER MD    Location [facility]-Cardiac & Pulmonary Rehab    Staff Present Birdie Sons, MPA, RN;Melissa [place], RDN, Rowe Pavy, BA, ACSM CEP, Exercise Physiologist    Virtual Visit No    Medication changes reported     No    Fall or balance concerns reported    No    Warm-up and Cool-down Performed on first and last piece of equipment    Resistance Training Performed Yes    VAD Patient? No    PAD/SET Patient? No      Pain Assessment   Currently in Pain? No/denies                Social History   Tobacco Use  Smoking Status Never  Smokeless Tobacco Never    Goals Met:  Independence with exercise equipment Exercise tolerated well No report of concerns or symptoms today Strength training completed today  Goals Unmet:  Not Applicable  Comments: Pt able to follow exercise prescription today without complaint.  Will continue to monitor for progression.    Dr. Emily Filbert is Medical Director for [place].  Dr. Ottie Glazier is Medical Director for [facility] Pulmonary Rehabilitation.

## 2021-03-17 NOTE — Patient Instructions (Signed)
Discharge Patient Instructions  Patient Details  Name: Thomas Rice MRN: [id number] Date of Birth: October 30, 1945 Referring Provider:  Andrez Grime, MD   Number of Visits: 36  Reason for Discharge:  Patient reached a stable level of exercise. Patient independent in their exercise. Patient has met program and personal goals.  Smoking History:  Social History   Tobacco Use  Smoking Status Never  Smokeless Tobacco Never    Diagnosis:  Status post coronary artery stent placement  Initial Exercise Prescription:  Initial Exercise Prescription - 01/13/21 1000       Date of Initial Exercise RX and Referring Provider   Date 01/13/21    Referring Provider Donnelly Angelica MD      Oxygen   Maintain Oxygen Saturation 88% or higher      Treadmill   MPH 2.4    Grade 1    Minutes 15    METs 3.17      NuStep   Level 3    SPM 80    Minutes 15    METs 3      Recumbant Elliptical   Level 1    RPM 50    Minutes 15    METs 2      Prescription Details   Frequency (times per week) 3    Duration Progress to 30 minutes of continuous aerobic without signs/symptoms of physical distress      Intensity   THRR 40-80% of Max Heartrate 99-130    Ratings of Perceived Exertion 11-13    Perceived Dyspnea 0-4      Progression   Progression Continue to progress workloads to maintain intensity without signs/symptoms of physical distress.      Resistance Training   Training Prescription Yes    Weight 4 lb    Reps 10-15             Discharge Exercise Prescription (Final Exercise Prescription Changes):  Exercise Prescription Changes - 03/09/21 0800       Home Exercise Plan   Plans to continue exercise at Home (comment)   walking, cubi   Frequency Add 2 additional days to program exercise sessions.    Initial Home Exercises Provided 03/09/21             Functional Capacity:  6 Minute Walk     Row Name 01/13/21 1025 03/11/21 0815       6 Minute Walk   Phase  Initial Discharge    Distance 1290 feet 1545 feet    Distance % Change -- 43 %    Distance Feet Change -- 555 ft    Walk Time 6 minutes 6 minutes    # of Rest Breaks 0 0    MPH 2.44 2.92    METS 3.12 3.74    RPE 9 12    VO2 Peak 10.91 13.1    Symptoms No No    Resting HR 69 bpm 82 bpm    Resting BP 146/60 124/68    Resting Oxygen Saturation  99 % 97 %    Exercise Oxygen Saturation  during 6 min walk 98 % 98 %    Max Ex. HR 109 bpm 126 bpm    Max Ex. BP 168/74 162/64    2 Minute Post BP 136/70 --             Nutrition & Weight - Outcomes:  Pre Biometrics - 01/13/21 1028       Pre Biometrics   Height  5' 8.4" (1.737 m)    Weight 172 lb 4.8 oz (78.2 kg)    BMI (Calculated) 25.9    Single Leg Stand 30 seconds             Post Biometrics - 03/11/21 0816        Post  Biometrics   Height 5' 8.4" (1.737 m)    Weight 169 lb 1.6 oz (76.7 kg)    BMI (Calculated) 25.42    Single Leg Stand 30 seconds             Nutrition:  Nutrition Therapy & Goals - 01/24/21 0719       Nutrition Therapy   Diet Heart healthy, low Na, T2DM    Drug/Food Interactions Statins/Certain Fruits    Protein (specify units) 60g    Fiber 30 grams    Whole Grain Foods 3 servings    Saturated Fats 12 max. grams    Fruits and Vegetables 8 servings/day    Sodium 1.5 grams      Personal Nutrition Goals   Nutrition Goal ST: add nuts/seeds or peanut butter to breakfast or a snack, add greek yogurt with fruit as snack if not eating lunch LT: meet protein needs regularly, continue with current changes    Comments 76 y.o. 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 He reports no previous issues with his heart that he knows of until the stent placement. He reports eating much of the same since his heart event, but he has limited freid foods and biscuits. He will cook a lot at home and has since he was young. He wakes up at around 4-5am  (he likes 5-6 hours). He reports he used to eat a biscuit on the way to work, now he will have a cup of coffee (1 splenda and 1 pack of cream) and bowl of cereal (cheerios and rice krispies woth non-fat milk)  L: may or may not eat lunch. A sandwich or peanut butter crackers (ham or bologna or [place]  on whole wheat bread) D: He likes to have homemade soup in the winter (vegetable or beef soup). Drinks: unsweetened iced tea, water. Broiled flounder out 1x/week, salmon or meat cooked at home on grill/ He reports not using salt and he will use corn oil for frying (seldom), now he uses it for airfrying. PYP: 73. Discussed heart healthy eating, reviewed T2DM MNT.      Intervention Plan   Intervention Prescribe, educate and counsel regarding individualized specific dietary modifications aiming towards targeted core components such as weight, hypertension, lipid management, diabetes, heart failure and other comorbidities.    Expected Outcomes Short Term Goal: Understand basic principles of dietary content, such as calories, fat, sodium, cholesterol and nutrients.;Short Term Goal: A plan has been developed with personal nutrition goals set during dietitian appointment.;Long Term Goal: Adherence to prescribed nutrition plan.              Goals reviewed with patient; copy given to patient.

## 2021-03-18 ENCOUNTER — Other Ambulatory Visit: Payer: Self-pay

## 2021-03-18 ENCOUNTER — Encounter: Payer: Medicare HMO | Admitting: *Deleted

## 2021-03-18 DIAGNOSIS — Z955 Presence of coronary angioplasty implant and graft: Secondary | ICD-10-CM

## 2021-03-18 NOTE — Progress Notes (Signed)
Daily Session Note  Patient Details  Name: Thomas Rice MRN: [id number] Date of Birth: 1945/05/29 Referring Provider:   Flowsheet Row Cardiac Rehab from 01/13/2021 in [facility] Card[facility]  Referring Provider Donnelly Angelica MD       Encounter Date: 03/18/2021  Check In:  Session Check In - 03/18/21 0838       Check-In   Supervising physician immediately available to respond to emergencies See telemetry face sheet for immediately available ER MD    Location [facility]-Cardiac & Pulmonary Rehab    Staff Present Alberteen Sam, MA, RCEP, CCRP, CCET;Joseph Latta, [place];Heath Lark, RN, BSN, CCRP    Virtual Visit No    Medication changes reported     No    Fall or balance concerns reported    No    Warm-up and Cool-down Performed on first and last piece of equipment    Resistance Training Performed Yes    VAD Patient? No    PAD/SET Patient? No      Pain Assessment   Currently in Pain? No/denies                Social History   Tobacco Use  Smoking Status Never  Smokeless Tobacco Never    Goals Met:  Independence with exercise equipment Exercise tolerated well No report of concerns or symptoms today  Goals Unmet:  Not Applicable  Comments: Pt able to follow exercise prescription today without complaint.  Will continue to monitor for progression.    Dr. Emily Filbert is Medical Director for [facility].  Dr. Ottie Glazier is Medical Director for [facility].

## 2021-03-21 ENCOUNTER — Encounter: Payer: Medicare HMO | Admitting: *Deleted

## 2021-03-21 ENCOUNTER — Other Ambulatory Visit: Payer: Self-pay

## 2021-03-21 DIAGNOSIS — Z955 Presence of coronary angioplasty implant and graft: Secondary | ICD-10-CM | POA: Diagnosis not present

## 2021-03-21 NOTE — Progress Notes (Signed)
Discharge Progress Report  Patient Details  Name: Thomas Rice MRN: [id number] Date of Birth: 1945-05-26 Referring Provider:   Flowsheet Row Cardiac Rehab from 01/13/2021 in [facility] Cardiac [facility] Pul[facility] Re[facility]  Referring Provider Donnelly Angelica MD        Number of Visits: 36  Reason for Discharge:  Patient reached a stable level of exercise. Patient independent in their exercise.  Diagnosis:  Status post coronary artery stent placement  Initial Exercise Prescription:  Initial Exercise Prescription - 01/13/21 1000       Date of Initial Exercise RX and Referring Provider   Date 01/13/21    Referring Provider Donnelly Angelica MD      Oxygen   Maintain Oxygen Saturation 88% or higher      Treadmill   MPH 2.4    Grade 1    Minutes 15    METs 3.17      NuStep   Level 3    SPM 80    Minutes 15    METs 3      Recumbant Elliptical   Level 1    RPM 50    Minutes 15    METs 2      Prescription Details   Frequency (times per week) 3    Duration Progress to 30 minutes of continuous aerobic without signs/symptoms of physical distress      Intensity   THRR 40-80% of Max Heartrate 99-130    Ratings of Perceived Exertion 11-13    Perceived Dyspnea 0-4      Progression   Progression Continue to progress workloads to maintain intensity without signs/symptoms of physical distress.      Resistance Training   Training Prescription Yes    Weight 4 lb    Reps 10-15             Discharge Exercise Prescription (Final Exercise Prescription Changes):  Exercise Prescription Changes - 03/09/21 0800       Home Exercise Plan   Plans to continue exercise at Home (comment)   walking, cubi   Frequency Add 2 additional days to program exercise sessions.    Initial Home Exercises Provided 03/09/21             Functional Capacity:  6 Minute Walk     Row Name 01/13/21 1025 03/11/21 0815       6 Minute Walk   Phase Initial Discharge    Distance 1290 feet 1545 feet     Distance % Change -- 43 %    Distance Feet Change -- 555 ft    Walk Time 6 minutes 6 minutes    # of Rest Breaks 0 0    MPH 2.44 2.92    METS 3.12 3.74    RPE 9 12    VO2 Peak 10.91 13.1    Symptoms No No    Resting HR 69 bpm 82 bpm    Resting BP 146/60 124/68    Resting Oxygen Saturation  99 % 97 %    Exercise Oxygen Saturation  during 6 min walk 98 % 98 %    Max Ex. HR 109 bpm 126 bpm    Max Ex. BP 168/74 162/64    2 Minute Post BP 136/70 --             Nutrition & Weight - Outcomes:  Pre Biometrics - 01/13/21 1028       Pre Biometrics   Height 5' 8.4" (1.737 m)  Weight 172 lb 4.8 oz (78.2 kg)    BMI (Calculated) 25.9    Single Leg Stand 30 seconds             Post Biometrics - 03/11/21 0816        Post  Biometrics   Height 5' 8.4" (1.737 m)    Weight 169 lb 1.6 oz (76.7 kg)    BMI (Calculated) 25.42    Single Leg Stand 30 seconds             Nutrition:  Nutrition Therapy & Goals - 01/24/21 0719       Nutrition Therapy   Diet Heart healthy, low Na, T2DM    Drug/Food Interactions Statins/Certain Fruits    Protein (specify units) 60g    Fiber 30 grams    Whole Grain Foods 3 servings    Saturated Fats 12 max. grams    Fruits and Vegetables 8 servings/day    Sodium 1.5 grams      Personal Nutrition Goals   Nutrition Goal ST: add nuts/seeds or peanut butter to breakfast or a snack, add greek yogurt with fruit as snack if not eating lunch LT: meet protein needs regularly, continue with current changes    Comments 76 y.o. 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 He reports no previous issues with his heart that he knows of until the stent placement. He reports eating much of the same since his heart event, but he has limited freid foods and biscuits. He will cook a lot at home and has since he was young. He wakes up at around 4-5am (he likes 5-6 hours). He reports he used to eat a  biscuit on the way to work, now he will have a cup of coffee (1 splenda and 1 pack of cream) and bowl of cereal (cheerios and rice krispies woth non-fat milk)  L: may or may not eat lunch. A sandwich or peanut butter crackers (ham or bologna or [place]  on whole wheat bread) D: He likes to have homemade soup in the winter (vegetable or beef soup). Drinks: unsweetened iced tea, water. Broiled flounder out 1x/week, salmon or meat cooked at home on grill/ He reports not using salt and he will use corn oil for frying (seldom), now he uses it for airfrying. PYP: 76. Discussed heart healthy eating, reviewed T2DM MNT.      Intervention Plan   Intervention Prescribe, educate and counsel regarding individualized specific dietary modifications aiming towards targeted core components such as weight, hypertension, lipid management, diabetes, heart failure and other comorbidities.    Expected Outcomes Short Term Goal: Understand basic principles of dietary content, such as calories, fat, sodium, cholesterol and nutrients.;Short Term Goal: A plan has been developed with personal nutrition goals set during dietitian appointment.;Long Term Goal: Adherence to prescribed nutrition plan.             Nutrition Discharge:   Goals reviewed with patient; copy given to patient.

## 2021-03-21 NOTE — Progress Notes (Signed)
Cardiac Individual Treatment Plan  Patient Details  Name: Thomas Rice MRN: [id number] Date of Birth: 1946-01-08 Referring Provider:   Flowsheet Row Cardiac Rehab from 01/13/2021 in [facility] Cardiac and Pulmonary Rehab  Referring Provider Donnelly Angelica MD       Initial Encounter Date:  Flowsheet Row Cardiac Rehab from 01/13/2021 in [facility] Cardiac and Pulmonary Rehab  Date 01/13/21       Visit Diagnosis: Status post coronary artery stent placement  Patient's Home Medications on Admission:  Current Outpatient Medications:    amLODipine (NORVASC) 5 MG tablet, Take 1 tablet (5 mg total) by mouth daily., Disp: 30 tablet, Rfl: 0   aspirin EC 81 MG tablet, Take 81 mg by mouth at bedtime., Disp: , Rfl:    atorvastatin (LIPITOR) 40 MG tablet, Take 40 mg by mouth daily., Disp: , Rfl:    ergocalciferol (VITAMIN D2) 1.25 MG (50000 UT) capsule, Take 50,000 Units by mouth every 30 (thirty) days., Disp: , Rfl:    glipiZIDE (GLUCOTROL XL) 5 MG 24 hr tablet, Take 10 mg by mouth daily., Disp: , Rfl:    latanoprost (XALATAN) 0.005 % ophthalmic solution, Place 1 drop into both eyes at bedtime., Disp: , Rfl:    linagliptin (TRADJENTA) 5 MG TABS tablet, Take 5 mg by mouth daily., Disp: , Rfl:    linagliptin (TRADJENTA) 5 MG TABS tablet, Take 1 tablet by mouth daily. (Patient not taking: Reported on 01/03/2021), Disp: , Rfl:    metoprolol succinate (TOPROL-XL) 25 MG 24 hr tablet, Take 25 mg by mouth daily., Disp: , Rfl:    nitroGLYCERIN (NITROSTAT) 0.4 MG SL tablet, Place 0.4 mg under the tongue every 5 (five) minutes x 3 doses as needed for chest pain., Disp: , Rfl:    triamcinolone cream (KENALOG) 0.1 %, Apply topically 2 (two) times daily., Disp: , Rfl:   Past Medical History: Past Medical History:  Diagnosis Date   Anemia    Bursitis    Chronic kidney disease    STAGE 3   Diabetes mellitus without complication (HCC)    Hyperkalemia    Hypertension     Tobacco Use: Social History   Tobacco Use   Smoking Status Never  Smokeless Tobacco Never    Labs: Recent Review Flowsheet Data     Labs for ITP Cardiac and Pulmonary Rehab Latest Ref Rng & Units 12/01/2020 12/02/2020   Cholestrol 0 - 200 mg/dL - 81   LDLCALC 0 - 99 mg/dL - 32   HDL >40 mg/dL - 33(L)   Trlycerides <150 mg/dL - 82   Hemoglobin A1c 4.8 - 5.6 % 7.6(H) -        Exercise Target Goals: Exercise Program Goal: Individual exercise prescription set using results from initial 6 min walk test and THRR while considering  patients activity barriers and safety.   Exercise Prescription Goal: Initial exercise prescription builds to 30-45 minutes a day of aerobic activity, 2-3 days per week.  Home exercise guidelines will be given to patient during program as part of exercise prescription that the participant will acknowledge.   Education: Aerobic Exercise: - Group verbal and visual presentation on the components of exercise prescription. Introduces F.I.T.T principle from ACSM for exercise prescriptions.  Reviews F.I.T.T. principles of aerobic exercise including progression. Written material given at graduation. Flowsheet Row Cardiac Rehab from 03/16/2021 in [facility] Cardiac and Pulmonary Rehab  Education need identified 01/13/21  Date 02/02/21  Educator [facility]  Instruction Review Code 1- Verbalizes Understanding  Education: Resistance Exercise: - Group verbal and visual presentation on the components of exercise prescription. Introduces F.I.T.T principle from ACSM for exercise prescriptions  Reviews F.I.T.T. principles of resistance exercise including progression. Written material given at graduation. Flowsheet Row Cardiac Rehab from 03/16/2021 in [facility] Cardiac and Pulmonary Rehab  Date 02/09/21  Educator [facility]  Instruction Review Code 1- Verbalizes Understanding        Education: Exercise & Equipment Safety: - Individual verbal instruction and demonstration of equipment use and safety with use of the  equipment. Flowsheet Row Cardiac Rehab from 03/16/2021 in [facility] Cardiac and Pulmonary Rehab  Date 01/03/21  Educator jh  Instruction Review Code 1- Verbalizes Understanding       Education: Exercise Physiology & General Exercise Guidelines: - Group verbal and written instruction with models to review the exercise physiology of the cardiovascular system and associated critical values. Provides general exercise guidelines with specific guidelines to those with heart or lung disease.  Flowsheet Row Cardiac Rehab from 03/16/2021 in [facility] Cardiac and Pulmonary Rehab  Date 01/26/21  Educator [facility]  Instruction Review Code 1- Verbalizes Understanding       Education: Flexibility, Balance, Mind/Body Relaxation: - Group verbal and visual presentation with interactive activity on the components of exercise prescription. Introduces F.I.T.T principle from ACSM for exercise prescriptions. Reviews F.I.T.T. principles of flexibility and balance exercise training including progression. Also discusses the mind body connection.  Reviews various relaxation techniques to help reduce and manage stress (i.e. Deep breathing, progressive muscle relaxation, and visualization). Balance handout provided to take home. Written material given at graduation. Flowsheet Row Cardiac Rehab from 03/16/2021 in [facility] Cardiac and Pulmonary Rehab  Date 02/16/21  Educator AS  Instruction Review Code 1- Verbalizes Understanding       Activity Barriers & Risk Stratification:  Activity Barriers & Cardiac Risk Stratification - 01/13/21 1026       Activity Barriers & Cardiac Risk Stratification   Activity Barriers Muscular Weakness;Deconditioning;Other (comment);Joint Problems    Comments left shoulder limited ROM    Cardiac Risk Stratification Moderate             6 Minute Walk:  6 Minute Walk     Row Name 01/13/21 1025 03/11/21 0815       6 Minute Walk   Phase Initial Discharge    Distance 1290 feet 1545 feet     Distance % Change -- 43 %    Distance Feet Change -- 555 ft    Walk Time 6 minutes 6 minutes    # of Rest Breaks 0 0    MPH 2.44 2.92    METS 3.12 3.74    RPE 9 12    VO2 Peak 10.91 13.1    Symptoms No No    Resting HR 69 bpm 82 bpm    Resting BP 146/60 124/68    Resting Oxygen Saturation  99 % 97 %    Exercise Oxygen Saturation  during 6 min walk 98 % 98 %    Max Ex. HR 109 bpm 126 bpm    Max Ex. BP 168/74 162/64    2 Minute Post BP 136/70 --             Oxygen Initial Assessment:   Oxygen Re-Evaluation:   Oxygen Discharge (Final Oxygen Re-Evaluation):   Initial Exercise Prescription:  Initial Exercise Prescription - 01/13/21 1000       Date of Initial Exercise RX and Referring Provider   Date 01/13/21    Referring  Provider Donnelly Angelica MD      Oxygen   Maintain Oxygen Saturation 88% or higher      Treadmill   MPH 2.4    Grade 1    Minutes 15    METs 3.17      NuStep   Level 3    SPM 80    Minutes 15    METs 3      Recumbant Elliptical   Level 1    RPM 50    Minutes 15    METs 2      Prescription Details   Frequency (times per week) 3    Duration Progress to 30 minutes of continuous aerobic without signs/symptoms of physical distress      Intensity   THRR 40-80% of Max Heartrate 99-130    Ratings of Perceived Exertion 11-13    Perceived Dyspnea 0-4      Progression   Progression Continue to progress workloads to maintain intensity without signs/symptoms of physical distress.      Resistance Training   Training Prescription Yes    Weight 4 lb    Reps 10-15             Perform Capillary Blood Glucose checks as needed.  Exercise Prescription Changes:   Exercise Prescription Changes     Row Name 01/13/21 1000 01/25/21 1200 02/08/21 0800 02/22/21 1200 03/08/21 1500     Response to Exercise   Blood Pressure (Admit) 146/60 152/80 142/64 136/76 132/70   Blood Pressure (Exercise) 168/74 152/64 128/58 -- --   Blood Pressure (Exit)  136/70 138/72 124/74 122/60 128/68   Heart Rate (Admit) 68 bpm 79 bpm 71 bpm 84 bpm 92 bpm   Heart Rate (Exercise) 109 bpm 114 bpm 110 bpm 104 bpm 102 bpm   Heart Rate (Exit) 77 bpm -- 81 bpm 89 bpm 94 bpm   Oxygen Saturation (Admit) 99 % -- -- -- --   Oxygen Saturation (Exercise) 98 % -- -- -- --   Rating of Perceived Exertion (Exercise) _0 Symptoms _1    Comments walk test results 4th day -- -- --   Duration -- Progress to 30 minutes of  aerobic without signs/symptoms of physical distress Continue with 30 min of aerobic exercise without signs/symptoms of physical distress. Continue with 30 min of aerobic exercise without signs/symptoms of physical distress. Continue with 30 min of aerobic exercise without signs/symptoms of physical distress.   Intensity -- THRR unchanged THRR unchanged THRR unchanged THRR unchanged     Progression   Progression -- Continue to progress workloads to maintain intensity without signs/symptoms of physical distress. Continue to progress workloads to maintain intensity without signs/symptoms of physical distress. Continue to progress workloads to maintain intensity without signs/symptoms of physical distress. Continue to progress workloads to maintain intensity without signs/symptoms of physical distress.   Average METs -- 2.86 3.13 3.35 3.51     Resistance Training   Training Prescription -- Yes Yes Yes Yes   Weight -- 4 lb 4 lb 4 lb 4 lb   Reps -- 10-15 10-15 10-15 10-15     Interval Training   Interval Training -- -- -- -- No     Treadmill   MPH -- 2.8 2.6 2.8 3   Grade -- 1.5 1 1.5 1.5   Minutes -- _2 METs -- 3.72 3.35 3.72 3.92     NuStep   Level -- --  _0 Minutes -- -- _1 METs -- -- 3.3 3 3.6     Recumbant Elliptical   Level -- 1 2 -- 5   RPM -- 50 -- -- --   Minutes -- 15 15 -- 15   METs -- 2 2.1 -- 2.9     Oxygen   Maintain Oxygen Saturation -- -- -- -- 88% or higher    Row Name  03/09/21 0800             Home Exercise Plan   Plans to continue exercise at Home (comment)  walking, cubi       Frequency Add 2 additional days to program exercise sessions.       Initial Home Exercises Provided 03/09/21                Exercise Comments:   Exercise Comments     Row Name 01/17/21 0743 03/21/21 0837         Exercise Comments First full day of exercise!  Patient was oriented to gym and equipment including functions, settings, policies, and procedures.  Patient's individual exercise prescription and treatment plan were reviewed.  All starting workloads were established based on the results of the 6 minute walk test done at initial orientation visit.  The plan for exercise progression was also introduced and progression will be customized based on patient's performance and goals. Thomas Rice graduated today from  rehab with 36 sessions completed.  Details of the patient's exercise prescription and what Thomas Rice needs to do in order to continue the prescription and progress were discussed with patient.  Patient was given a copy of prescription and goals.  Patient verbalized understanding.  Thomas Rice plans to continue to exercise by joining the [employer].               Exercise Goals and Review:   Exercise Goals     Row Name 01/13/21 1027             Exercise Goals   Increase Physical Activity Yes       Intervention Develop an individualized exercise prescription for aerobic and resistive training based on initial evaluation findings, risk stratification, comorbidities and participant's personal goals.;Provide advice, education, support and counseling about physical activity/exercise needs.       Expected Outcomes Short Term: Attend rehab on a regular basis to increase amount of physical activity.;Long Term: Add in home exercise to make exercise part of routine and to increase amount of physical activity.;Long Term: Exercising regularly at least 3-5 days a  week.       Increase Strength and Stamina Yes       Intervention Provide advice, education, support and counseling about physical activity/exercise needs.;Develop an individualized exercise prescription for aerobic and resistive training based on initial evaluation findings, risk stratification, comorbidities and participant's personal goals.       Expected Outcomes Short Term: Increase workloads from initial exercise prescription for resistance, speed, and METs.;Short Term: Perform resistance training exercises routinely during rehab and add in resistance training at home;Long Term: Improve cardiorespiratory fitness, muscular endurance and strength as measured by increased METs and functional capacity (6MWT)       Able to understand and use rate of perceived exertion (RPE) scale Yes       Intervention Provide education and explanation on how to use RPE scale       Expected Outcomes Long Term:  Able to use RPE to  guide intensity level when exercising independently;Short Term: Able to use RPE daily in rehab to express subjective intensity level       Able to understand and use Dyspnea scale Yes       Intervention Provide education and explanation on how to use Dyspnea scale       Expected Outcomes Short Term: Able to use Dyspnea scale daily in rehab to express subjective sense of shortness of breath during exertion;Long Term: Able to use Dyspnea scale to guide intensity level when exercising independently       Knowledge and understanding of Target Heart Rate Range (THRR) Yes       Intervention Provide education and explanation of THRR including how the numbers were predicted and where they are located for reference       Expected Outcomes Short Term: Able to state/look up THRR;Short Term: Able to use daily as guideline for intensity in rehab;Long Term: Able to use THRR to govern intensity when exercising independently       Able to check pulse independently Yes       Intervention Provide education and  demonstration on how to check pulse in carotid and radial arteries.;Review the importance of being able to check your own pulse for safety during independent exercise       Expected Outcomes Short Term: Able to explain why pulse checking is important during independent exercise;Long Term: Able to check pulse independently and accurately       Understanding of Exercise Prescription Yes       Intervention Provide education, explanation, and written materials on patient's individual exercise prescription       Expected Outcomes Short Term: Able to explain program exercise prescription;Long Term: Able to explain home exercise prescription to exercise independently                Exercise Goals Re-Evaluation :  Exercise Goals Re-Evaluation     Row Name 01/17/21 0743 01/25/21 1251 02/08/21 0834 02/22/21 1236 02/22/21 1239     Exercise Goal Re-Evaluation   Exercise Goals Review Increase Physical Activity;Able to understand and use rate of perceived exertion (RPE) scale;Knowledge and understanding of Target Heart Rate Range (THRR);Understanding of Exercise Prescription;Able to understand and use Dyspnea scale;Able to check pulse independently;Increase Strength and Stamina Increase Physical Activity;Increase Strength and Stamina Increase Physical Activity;Increase Strength and Stamina Increase Physical Activity;Increase Strength and Stamina Increase Physical Activity;Increase Strength and Stamina   Comments Reviewed RPE and dyspnea scales, THR and program prescription with pt today.  Pt voiced understanding and was given a copy of goals to take home. Thomas Rice is doing well and has increased TM to 2.8 mph and 1.5 incline.  Staff will monitor progress. Thomas Rice continues to do well in rehab. His treadmill speeds and incline vary each session and Thomas Rice would benefit from keep consistency and gradually increasing his loads. Thomas Rice has increased to level 2 on the REL. Will continue to monitor. -- Thomas Rice atends consistently and  works in [employer] range.  Thomas Rice has gradually increased levels on all machines.  Staff will encourage trying  5 lb for strength.   Expected Outcomes Short: Use RPE daily to regulate intensity. Long: Follow program prescription in THR. Short: attend consistently Long:  improve overall stamina Short: Keep treadmill loads consistent and gradually increase Long: Continue to increase overall MET level -- Short: try 5 lb Long:  continue to build stamina    Row Name 03/08/21 1552 03/09/21 0841 03/16/21 0810  Exercise Goal Re-Evaluation   Exercise Goals Review Increase Physical Activity;Increase Strength and Stamina;Understanding of Exercise Prescription Increase Physical Activity;Increase Strength and Stamina;Understanding of Exercise Prescription;Able to understand and use rate of perceived exertion (RPE) scale;Able to understand and use Dyspnea scale;Knowledge and understanding of Target Heart Rate Range (THRR);Able to check pulse independently Increase Physical Activity;Increase Strength and Stamina;Understanding of Exercise Prescription;Able to understand and use rate of perceived exertion (RPE) scale;Able to understand and use Dyspnea scale;Knowledge and understanding of Target Heart Rate Range (THRR);Able to check pulse independently     Comments Thomas Rice is doing well in rehab.  Thomas Rice is on level 5 on the recumbent elliptical and up to 3.0 mph on the treadmill.  We will conitnue to monitor his progress. Thomas Rice was hesitant to increase hand weights given his shoulder problems but we will continue to encourage him to try. Reviewed home exercise with pt today.  Pt plans to walk and use Cubi at home for exercise.  Reviewed THR, pulse, RPE, sign and symptoms, pulse oximetery and when to call 911 or MD.  Also discussed weather considerations and indoor options.  Pt voiced understanding. 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 Thomas Rice reports using Cubi every morning at home for exercise.     Expected Outcomes  Short: Add more incline to treadmill Long: Conitnue to improve stamina. Short: Continue to exercise on off days Long; continue to improve stamina Short: Continue to exercise on off days Long; continue to improve stamina              Discharge Exercise Prescription (Final Exercise Prescription Changes):  Exercise Prescription Changes - 03/09/21 0800       Home Exercise Plan   Plans to continue exercise at Home (comment)   walking, cubi   Frequency Add 2 additional days to program exercise sessions.    Initial Home Exercises Provided 03/09/21             Nutrition:  Target Goals: Understanding of nutrition guidelines, daily intake of sodium <1575m, cholesterol <2063m calories 30% from fat and 7% or less from saturated fats, daily to have 5 or more servings of fruits and vegetables.  Education: All About Nutrition: -Group instruction provided by verbal, written material, interactive activities, discussions, models, and posters to present general guidelines for heart healthy nutrition including fat, fiber, MyPlate, the role of sodium in heart healthy nutrition, utilization of the nutrition label, and utilization of this knowledge for meal planning. Follow up email sent as well. Written material given at graduation. [facility]  Date 03/02/21  [facility] Review Code 1- Verbalizes Understanding       Biometrics:  Pre Biometrics - 01/13/21 1028       Pre Biometrics   Height 5' 8.4" (1.737 m)    Weight 172 lb 4.8 oz (78.2 kg)    BMI (Calculated) 25.9    Single Leg Stand 30 seconds             Post Biometrics - 03/11/21 0816        Post  Biometrics   Height 5' 8.4" (1.737 m)    Weight 169 lb 1.6 oz (76.7 kg)    BMI (Calculated) 25.42    Single Leg Stand 30 seconds             Nutrition Therapy Plan and Nutrition Goals:  Nutrition Therapy & Goals - 01/24/21 0719  Nutrition Therapy   Diet Heart healthy, low Na, T2DM    Drug/Food Interactions Statins/Certain Fruits    Protein (specify units) 60g    Fiber 30 grams    Whole Grain Foods 3 servings    Saturated Fats 12 max. grams    Fruits and Vegetables 8 servings/day    Sodium 1.5 grams      Personal Nutrition Goals   Nutrition Goal ST: add nuts/seeds or peanut butter to breakfast or a snack, add greek yogurt with fruit as snack if not eating lunch LT: meet protein needs regularly, continue with current changes    Comments 76 y.o. 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 Thomas Rice reports no previous issues with his heart that Thomas Rice knows of until the stent placement. Thomas Rice reports eating much of the same since his heart event, but Thomas Rice has limited freid foods and biscuits. Thomas Rice will cook a lot at home and has since Thomas Rice was young. Thomas Rice wakes up at around 4-5am (Thomas Rice likes 5-6 hours). Thomas Rice reports Thomas Rice used to eat a biscuit on the way to work, now Thomas Rice will have a cup of coffee (1 splenda and 1 pack of cream) and bowl of cereal (cheerios and rice krispies woth non-fat milk)  L: may or may not eat lunch. A sandwich or peanut butter crackers (ham or bologna or [place]  on whole wheat bread) D: Thomas Rice likes to have homemade soup in the winter (vegetable or beef soup). Drinks: unsweetened iced tea, water. Broiled flounder out 1x/week, salmon or meat cooked at home on grill/ Thomas Rice reports not using salt and Thomas Rice will use corn oil for frying (seldom), now Thomas Rice uses it for airfrying. PYP: 73. Discussed heart healthy eating, reviewed T2DM MNT.      Intervention Plan   Intervention Prescribe, educate and counsel regarding individualized specific dietary modifications aiming towards targeted core components such as weight, hypertension, lipid management, diabetes, heart failure and other comorbidities.    Expected Outcomes Short Term Goal: Understand basic principles of dietary content, such as  calories, fat, sodium, cholesterol and nutrients.;Short Term Goal: A plan has been developed with personal nutrition goals set during dietitian appointment.;Long Term Goal: Adherence to prescribed nutrition plan.             Nutrition Assessments:  MEDIFICTS Score Key: ?70 Need to make dietary changes  40-70 Heart Healthy Diet ? 40 Therapeutic Level Cholesterol Diet  Flowsheet Row Cardiac Rehab from 03/14/2021 in [facility] Cardiac and Pulmonary Rehab  Picture Your Plate Total Score on Admission 73  Picture Your Plate Total Score on Discharge 73      Picture Your Plate Scores: <40 Unhealthy dietary pattern with much room for improvement. 41-50 Dietary pattern unlikely to meet recommendations for good health and room for improvement. 51-60 More healthful dietary pattern, with some room for improvement.  >60 Healthy dietary pattern, although there may be some specific behaviors that could be improved.    Nutrition Goals Re-Evaluation:  Nutrition Goals Re-Evaluation     Row Name 02/11/21 0818 03/16/21 0759           Goals   Current Weight 169 lb (76.7 kg) --      Nutrition Goal -- ST: greek yogurt with fruit as snack if not eating lunch LT: meet protein needs regularly, continue with current changes      Comment Thomas Rice has been eating at home more and not going out.  Thomas Rice eats Cereal, oatmeal, grits. Thomas Rice feels like his diet is good and wants to maintain his weight. Cassian reports using a lot of peanut butter and Thomas Rice take it as part of his lunch, Thomas Rice has greatly limited fast food. For breakfast Thomas Rice will have fat-free milk, coffee with splenda, and cereal, oatmeal, or grits. Thomas Rice eats eggs about 1x/week. Thomas Rice is hapy with the changes Thomas Rice made and is not missing the fast food at all.      Expected Outcome Short: continue eating at home. Long: maintain a diet that pertains to him independently. Short: continue eating at home more. Long: maintain a diet that pertains to him independently.                Nutrition Goals Discharge (Final Nutrition Goals Re-Evaluation):  Nutrition Goals Re-Evaluation - 03/16/21 0759       Goals   Nutrition Goal ST: greek yogurt with fruit as snack if not eating lunch LT: meet protein needs regularly, continue with current changes    Comment Thomas Rice reports using a lot of peanut butter and Thomas Rice take it as part of his lunch, Thomas Rice has greatly limited fast food. For breakfast Thomas Rice will have fat-free milk, coffee with splenda, and cereal, oatmeal, or grits. Thomas Rice eats eggs about 1x/week. Thomas Rice is hapy with the changes Thomas Rice made and is not missing the fast food at all.    Expected Outcome Short: continue eating at home more. Long: maintain a diet that pertains to him independently.             Psychosocial: Target Goals: Acknowledge presence or absence of significant depression and/or stress, maximize coping skills, provide positive support system. Participant is able to verbalize types and ability to use techniques and skills needed for reducing stress and depression.   Education: Stress, Anxiety, and Depression - Group verbal and visual presentation to define topics covered.  Reviews how body is impacted by stress, anxiety, and depression.  Also discusses healthy ways to reduce stress and to treat/manage anxiety and depression.  Written material given at graduation. Flowsheet Row Cardiac Rehab from 03/16/2021 in [facility] Cardiac and Pulmonary Rehab  Education need identified 01/13/21  Date 01/19/21  Educator [facility]  Instruction Review Code 1- [employer] Understanding       Education: Sleep Hygiene -Provides group verbal and written instruction about how sleep can affect your health.  Define sleep hygiene, discuss sleep cycles and impact of sleep habits. Review good sleep hygiene tips.    Initial Review & Psychosocial Screening:  Initial Psych Review & Screening - 01/03/21 1113       Initial Review   Current issues with None Identified      Family Dynamics   Good  Support System? Yes    Comments Thomas Rice lost his wife two years ago and is support system is his sister in Sports coach. Thomas Rice reports no issues with his mental health.      Barriers   Psychosocial barriers to participate in program The patient should benefit from training in stress management and relaxation.      Screening Interventions   Interventions Encouraged to exercise;Provide feedback about the scores to participant;To provide support and resources with identified psychosocial needs    Expected Outcomes Short Term goal: Utilizing psychosocial counselor, staff and physician to assist with identification of specific Stressors or current issues interfering with healing process. Setting desired goal for each stressor or current issue identified.;Long Term Goal: Stressors or current issues are controlled or eliminated.;Short Term  goal: Identification and review with participant of any Quality of Life or Depression concerns found by scoring the questionnaire.;Long Term goal: The participant improves quality of Life and PHQ9 Scores as seen by post scores and/or verbalization of changes             Quality of Life Scores:   Quality of Life - 03/14/21 0752       Quality of Life   Select Quality of Life      Quality of Life Scores   Health/Function Pre 27.27 %    Health/Function Post 28.04 %    Health/Function % Change 2.82 %    Socioeconomic Pre 24.5 %    Socioeconomic Post 28.21 %    Socioeconomic % Change  15.14 %    Psych/Spiritual Pre 29.64 %    Psych/Spiritual Post 28.07 %    Psych/Spiritual % Change -5.3 %    Family Pre 15 %    Family Post 25 %    Family % Change 66.67 %    GLOBAL Pre 25.36 %    GLOBAL Post 27.88 %    GLOBAL % Change 9.94 %            Scores of 19 and below usually indicate a poorer quality of life in these areas.  A difference of  2-3 points is a clinically meaningful difference.  A difference of 2-3 points in the total score of the Quality of Life Index has been  associated with significant improvement in overall quality of life, self-image, physical symptoms, and general health in studies assessing change in quality of life.  PHQ-9: Recent Review Flowsheet Data     Depression screen [facility] 2/9 03/14/2021 01/13/2021   Decreased Interest 0 0   Down, Depressed, Hopeless 0 0   PHQ - 2 Score 0 0   Altered sleeping 0 0   Tired, decreased energy 0 0   Change in appetite 0 0   Feeling bad or failure about yourself  0 0   Trouble concentrating 0 0   Moving slowly or fidgety/restless 0 0   Suicidal thoughts 0 0   PHQ-9 Score 0 0   Difficult doing work/chores Not difficult at all Not difficult at all      Interpretation of Total Score  Total Score Depression Severity:  1-4 = Minimal depression, 5-9 = Mild depression, 10-14 = Moderate depression, 15-19 = Moderately severe depression, 20-27 = Severe depression   Psychosocial Evaluation and Intervention:  Psychosocial Evaluation - 01/03/21 1114       Psychosocial Evaluation & Interventions   Interventions Encouraged to exercise with the program and follow exercise prescription;Relaxation education;Stress management education    Comments Thomas Rice lost his wife two years ago and is support system is his sister in Sports coach. Thomas Rice reports no issues with his mental health.    Expected Outcomes Short: Start [facility] to help with mood. Long: Maintain a healthy mental state    Continue Psychosocial Services  Follow up required by staff             Psychosocial Re-Evaluation:  Psychosocial Re-Evaluation     [employer] Name 02/11/21 0818 03/16/21 0809           Psychosocial Re-Evaluation   Current issues with None Identified None Identified      Comments Patient reports no issues with their current mental states, sleep, stress, depression or anxiety. Will follow up with patient in a few weeks for any changes. Patient reports no issues  with their current mental states, sleep, stress, depression or anxiety. Thomas Rice reports not  letting anything Thomas Rice can't control get to him and maintains a positive attitude. Thomas Rice relies on his sister in-law for support, but also reports having lots of people in his life like friends that care for him. His wife had passed away and they never had any children so they donated to [facility]. Thomas Rice reports no problems with sleep. Will follow up with patient in a few weeks for any changes.      Expected Outcomes Short: Continue to exercise regularly to support mental health and notify staff of any changes. Long: maintain mental health and well being through teaching of rehab or prescribed medications independently. Short: Continue to exercise regularly to support mental health and notify staff of any changes. Long: maintain mental health and well being through teaching of rehab or prescribed medications independently.      Interventions Encouraged to attend Cardiac Rehabilitation for the exercise Encouraged to attend Cardiac Rehabilitation for the exercise      Continue Psychosocial Services  Follow up required by staff Follow up required by staff               Psychosocial Discharge (Final Psychosocial Re-Evaluation):  Psychosocial Re-Evaluation - 03/16/21 0809       Psychosocial Re-Evaluation   Current issues with None Identified    Comments Patient reports no issues with their current mental states, sleep, stress, depression or anxiety. Thomas Rice reports not letting anything Thomas Rice can't control get to him and maintains a positive attitude. Thomas Rice relies on his sister in-law for support, but also reports having lots of people in his life like friends that care for him. His wife had passed away and they never had any children so they donated to [facility]. Thomas Rice reports no problems with sleep. Will follow up with patient in a few weeks for any changes.    Expected Outcomes Short: Continue to exercise regularly to support mental health and notify staff of any changes. Long: maintain mental health and well being through  teaching of rehab or prescribed medications independently.    Interventions Encouraged to attend Cardiac Rehabilitation for the exercise    Continue Psychosocial Services  Follow up required by staff             Vocational Rehabilitation: Provide vocational rehab assistance to qualifying candidates.   Vocational Rehab Evaluation & Intervention:  Vocational Rehab - 03/14/21 0756       Discharge Vocational Rehab   Discharge Vocational Rehabilitation retired             Education: Education Goals: Education classes will be provided on a variety of topics geared toward better understanding of heart health and risk factor modification. Participant will state understanding/return demonstration of topics presented as noted by education test scores.  Learning Barriers/Preferences:  Learning Barriers/Preferences - 01/03/21 1112       Learning Barriers/Preferences   Learning Barriers None    Learning Preferences None             General Cardiac Education Topics:  AED/CPR: - Group verbal and written instruction with the use of models to demonstrate the basic use of the AED with the basic ABC's of resuscitation.   Anatomy and Cardiac Procedures: - Group verbal and visual presentation and models provide information about basic cardiac anatomy and function. Reviews the testing methods done to diagnose heart disease and the outcomes of the test results. Describes the treatment choices: Medical  Management, Angioplasty, or Coronary Bypass Surgery for treating various heart conditions including Myocardial Infarction, Angina, Valve Disease, and Cardiac Arrhythmias.  Written material given at graduation. Flowsheet Row Cardiac Rehab from 03/16/2021 in [facility] Cardiac and Pulmonary Rehab  Education need identified 01/13/21  Date 02/09/21  Educator SB  Instruction Review Code 1- Verbalizes Understanding       Medication Safety: - Group verbal and visual instruction to review commonly  prescribed medications for heart and lung disease. Reviews the medication, class of the drug, and side effects. Includes the steps to properly store meds and maintain the prescription regimen.  Written material given at graduation. Flowsheet Row Cardiac Rehab from 03/16/2021 in [facility] Cardiac and Pulmonary Rehab  Date 02/23/21  Educator SB  Instruction Review Code 1- Verbalizes Understanding       Intimacy: - Group verbal instruction through game format to discuss how heart and lung disease can affect sexual intimacy. Written material given at graduation.. Flowsheet Row Cardiac Rehab from 03/16/2021 in [facility] Cardiac and Pulmonary Rehab  Date 02/02/21  Educator [facility]  Instruction Review Code 1- Verbalizes Understanding       Know Your Numbers and Heart Failure: - Group verbal and visual instruction to discuss disease risk factors for cardiac and pulmonary disease and treatment options.  Reviews associated critical values for Overweight/Obesity, Hypertension, Cholesterol, and Diabetes.  Discusses basics of heart failure: signs/symptoms and treatments.  Introduces Heart Failure Zone chart for action plan for heart failure.  Written material given at graduation. Flowsheet Row Cardiac Rehab from 03/16/2021 in [facility] Cardiac and Pulmonary Rehab  Date 03/09/21  Educator SB  Instruction Review Code 1- Verbalizes Understanding       Infection Prevention: - Provides verbal and written material to individual with discussion of infection control including proper hand washing and proper equipment cleaning during exercise session. Flowsheet Row Cardiac Rehab from 03/16/2021 in [facility] Cardiac and Pulmonary Rehab  Date 01/03/21  Educator jh  Instruction Review Code 1- Verbalizes Understanding       Falls Prevention: - Provides verbal and written material to individual with discussion of falls prevention and safety. Flowsheet Row Cardiac Rehab from 03/16/2021 in [facility] Cardiac and Pulmonary Rehab  Date 01/03/21   Educator jh  Instruction Review Code 1- Verbalizes Understanding       Other: -Provides group and verbal instruction on various topics (see comments)   Knowledge Questionnaire Score:  Knowledge Questionnaire Score - 03/14/21 0754       Knowledge Questionnaire Score   Pre Score 22/26    Post Score 24/26             Core Components/Risk Factors/Patient Goals at Admission:  Personal Goals and Risk Factors at Admission - 01/13/21 1029       Core Components/Risk Factors/Patient Goals on Admission    Weight Management Yes;Weight Maintenance    Intervention Weight Management: Develop a combined nutrition and exercise program designed to reach desired caloric intake, while maintaining appropriate intake of nutrient and fiber, sodium and fats, and appropriate energy expenditure required for the weight goal.;Weight Management: Provide education and appropriate resources to help participant work on and attain dietary goals.;Weight Management/Obesity: Establish reasonable short term and long term weight goals.    Admit Weight 172 lb 4.8 oz (78.2 kg)    Goal Weight: Short Term 172 lb (78 kg)    Goal Weight: Long Term 172 lb (78 kg)    Expected Outcomes Short Term: Continue to assess and modify interventions until short term weight  is achieved;Long Term: Adherence to nutrition and physical activity/exercise program aimed toward attainment of established weight goal;Weight Maintenance: Understanding of the daily nutrition guidelines, which includes 25-35% calories from fat, 7% or less cal from saturated fats, less than 231m cholesterol, less than 1.5gm of sodium, & 5 or more servings of fruits and vegetables daily;Understanding recommendations for meals to include 15-35% energy as protein, 25-35% energy from fat, 35-60% energy from carbohydrates, less than 2072mof dietary cholesterol, 20-35 gm of total fiber daily;Understanding of distribution of calorie intake throughout the day with the  consumption of 4-5 meals/snacks    Diabetes Yes    Intervention Provide education about signs/symptoms and action to take for hypo/hyperglycemia.;Provide education about proper nutrition, including hydration, and aerobic/resistive exercise prescription along with prescribed medications to achieve blood glucose in normal ranges: Fasting glucose 65-99 mg/dL    Expected Outcomes Short Term: Participant verbalizes understanding of the signs/symptoms and immediate care of hyper/hypoglycemia, proper foot care and importance of medication, aerobic/resistive exercise and nutrition plan for blood glucose control.;Long Term: Attainment of HbA1C < 7%.    Hypertension Yes    Intervention Provide education on lifestyle modifcations including regular physical activity/exercise, weight management, moderate sodium restriction and increased consumption of fresh fruit, vegetables, and low fat dairy, alcohol moderation, and smoking cessation.;Monitor prescription use compliance.    Expected Outcomes Short Term: Continued assessment and intervention until BP is < 140/9085mG in hypertensive participants. < 130/17m38m in hypertensive participants with diabetes, heart failure or chronic kidney disease.;Long Term: Maintenance of blood pressure at goal levels.             Education:Diabetes - Individual verbal and written instruction to review signs/symptoms of diabetes, desired ranges of glucose level fasting, after meals and with exercise. Acknowledge that pre and post exercise glucose checks will be done for 3 sessions at entry of program. [facility]  Date 01/03/21  Educator jh  Instruction Review Code 1- Verbalizes Understanding       Core Components/Risk Factors/Patient Goals Review:   Goals and Risk Factor Review     Row Name 02/11/21 0820 03/16/21 0806           Core Components/Risk Factors/Patient Goals Review   Personal Goals  Review Weight Management/Obesity;Hypertension Weight Management/Obesity;Hypertension;Diabetes      Review Thomas Rice Thomas Rice wants to maintain his weight. Thomas Rice has been doing well with his exercise and diet. Thomas Rice has been checking his blood pressure at home and nmonitoring his heart rate. Thomas Rice states Thomas Rice wants to maintain his weight and it has been steady. Thomas Rice has been doing well with his exercise and diet. Thomas Rice has been checking his blood pressure at home (120s/60s); today in rehab it was 128/64. Thomas Rice continues to monitor his heart rate. Thomas Rice is taking all his medications as prescribed with no issues. Thomas Rice reports going to his kidney doctor soon. Thomas Rice is now taking Jardiance for his T2DM. BG now is higher than Thomas Rice wants it, but has been going down since taking the Jardiance.      Expected Outcomes Short: keep checking blood pressures at home and heart rate. Long: maintain blood pressures and vital signs at home independently. Short: keep checking blood pressures at home and heart rate. Long: maintain blood pressures and vital signs at home independently.               Core Components/Risk Factors/Patient Goals at Discharge (Final Review):   Goals and  Risk Factor Review - 03/16/21 0806       Core Components/Risk Factors/Patient Goals Review   Personal Goals Review Weight Management/Obesity;Hypertension;Diabetes    Review Thomas Rice states Thomas Rice wants to maintain his weight and it has been steady. Thomas Rice has been doing well with his exercise and diet. Thomas Rice has been checking his blood pressure at home (120s/60s); today in rehab it was 128/64. Thomas Rice continues to monitor his heart rate. Thomas Rice is taking all his medications as prescribed with no issues. Thomas Rice reports going to his kidney doctor soon. Thomas Rice is now taking Jardiance for his T2DM. BG now is higher than Thomas Rice wants it, but has been going down since taking the Jardiance.    Expected Outcomes Short: keep checking blood pressures at home and heart rate. Long: maintain blood pressures and vital  signs at home independently.             ITP Comments:  ITP Comments     Row Name 01/03/21 1110 01/13/21 1025 01/17/21 0742 01/24/21 0815 02/02/21 0726   ITP Comments Virtual Visit completed. Patient informed on EP and RD appointment and 6 Minute walk test. Patient also informed of patient health questionnaires on My Chart. Patient Verbalizes understanding. Visit diagnosis can be found in [facility] 12/09/2020. Completed 6MWT and gym orientation. Initial ITP created and sent for review to Dr. Emily Filbert, Medical Director. First full day of exercise!  Patient was oriented to gym and equipment including functions, settings, policies, and procedures.  Patient's individual exercise prescription and treatment plan were reviewed.  All starting workloads were established based on the results of the 6 minute walk test done at initial orientation visit.  The plan for exercise progression was also introduced and progression will be customized based on patient's performance and goals. Completed initial RD consultation 30 Day review completed. Medical Director ITP review done, changes made as directed, and signed approval by Medical Director.    Thomas Rice Name 03/02/21 0856 03/21/21 0837         ITP Comments 30 Day review completed. Medical Director ITP review done, changes made as directed, and signed approval by Medical Director. Thomas Rice graduated today from  rehab with 36 sessions completed.  Details of the patient's exercise prescription and what Thomas Rice needs to do in order to continue the prescription and progress were discussed with patient.  Patient was given a copy of prescription and goals.  Patient verbalized understanding.  Thomas Rice plans to continue to exercise by joining the [employer].               Comments: Discharge ITP

## 2021-03-21 NOTE — Progress Notes (Signed)
Daily Session Note  Patient Details  Name: Thomas Rice MRN: [id number] Date of Birth: 28-Jun-1945 Referring Provider:   Flowsheet Row Cardiac Rehab from 01/13/2021 in [facility] Cardiac and Pulmonary Rehab  Referring Provider Donnelly Angelica MD       Encounter Date: 03/21/2021  Check In:  Session Check In - 03/21/21 0835       Check-In   Supervising physician immediately available to respond to emergencies See telemetry face sheet for immediately available ER MD    Location [facility]-Cardiac & Pulmonary Rehab    Staff Present Heath Lark, RN, BSN, CCRP;Joseph [place], RCP,RRT,BSRT;Kelly [place], [place], ACSM CEP, Exercise Physiologist    Virtual Visit No    Medication changes reported     No    Fall or balance concerns reported    No    Warm-up and Cool-down Performed on first and last piece of equipment    Resistance Training Performed Yes    VAD Patient? No    PAD/SET Patient? No      Pain Assessment   Currently in Pain? No/denies                Social History   Tobacco Use  Smoking Status Never  Smokeless Tobacco Never    Goals Met:  Independence with exercise equipment Exercise tolerated well Personal goals reviewed No report of concerns or symptoms today  Goals Unmet:  Not Applicable  Comments:  Thomas Rice graduated today from  rehab with 36 sessions completed.  Details of the patient's exercise prescription and what He needs to do in order to continue the prescription and progress were discussed with patient.  Patient was given a copy of prescription and goals.  Patient verbalized understanding.  Thomas Rice plans to continue to exercise by joining the [employer].    Dr. Emily Filbert is Medical Director for [place].  Dr. Ottie Glazier is Medical Director for [facility].

## 2022-07-17 ENCOUNTER — Other Ambulatory Visit: Payer: Self-pay

## 2022-07-17 ENCOUNTER — Emergency Department
Admission: EM | Admit: 2022-07-17 | Discharge: 2022-07-17 | Disposition: A | Discharge status: Home / Self Care | Payer: Medicare HMO | Attending: Emergency Medicine | Admitting: Emergency Medicine

## 2022-07-17 DIAGNOSIS — Z139 Encounter for screening, unspecified: Secondary | ICD-10-CM

## 2022-07-17 DIAGNOSIS — R799 Abnormal finding of blood chemistry, unspecified: Secondary | ICD-10-CM | POA: Insufficient documentation

## 2022-07-17 DIAGNOSIS — E875 Hyperkalemia: Secondary | ICD-10-CM | POA: Diagnosis not present

## 2022-07-17 DIAGNOSIS — Z0000 Encounter for general adult medical examination without abnormal findings: Secondary | ICD-10-CM | POA: Diagnosis not present

## 2022-07-17 LAB — BASIC METABOLIC PANEL
Anion gap: 8 (ref 5–15)
BUN: 39 mg/dL — ABNORMAL HIGH (ref 8–23)
CO2: 17 mmol/L — ABNORMAL LOW (ref 22–32)
Calcium: 8.7 mg/dL — ABNORMAL LOW (ref 8.9–10.3)
Chloride: 112 mmol/L — ABNORMAL HIGH (ref 98–111)
Creatinine, Ser: 1.79 mg/dL — ABNORMAL HIGH (ref 0.61–1.24)
GFR, Estimated: 39 mL/min — ABNORMAL LOW (ref >60)
Glucose, Bld: 203 mg/dL — ABNORMAL HIGH (ref 70–99)
Potassium: 4.8 mmol/L (ref 3.5–5.1)
Sodium: 137 mmol/L (ref 135–145)

## 2022-07-17 LAB — CBC
HCT: 38.3 % — ABNORMAL LOW (ref 39.0–52.0)
Hemoglobin: 12.6 g/dL — ABNORMAL LOW (ref 13.0–17.0)
MCH: 30.3 pg (ref 26.0–34.0)
MCHC: 32.9 g/dL (ref 30.0–36.0)
MCV: 92.1 fL (ref 80.0–100.0)
Platelets: 189 10*3/uL (ref 150–400)
RBC: 4.16 MIL/uL — ABNORMAL LOW (ref 4.22–5.81)
RDW: 13.2 % (ref 11.5–15.5)
WBC: 8.9 10*3/uL (ref 4.0–10.5)
nRBC: 0 % (ref 0.0–0.2)

## 2022-07-17 LAB — TROPONIN I (HIGH SENSITIVITY): Troponin I (High Sensitivity): 13 ng/L (ref <18)

## 2022-07-17 NOTE — ED Triage Notes (Signed)
Told to come to ED by PCP, states that bloodwork was done on Thursday, K: 8.8.  Patient denies all complaint.  AAOx3.  Skin warm and dry. NAD

## 2022-07-17 NOTE — Discharge Instructions (Signed)
Your potassium was normal today

## 2022-07-17 NOTE — ED Provider Notes (Signed)
[facility] Provider Note    Event Date/Time   First MD Initiated Contact with Patient 07/17/22 1227     (approximate)   History   abnormal labs   HPI  Thomas Rice is a 77 y.o. male sent by his PCP for evaluation, patient had labs drawn routinely last week at PCP, was called today and notified of potassium of 8.8 and told to come to the emergency department.  He feels well and has no complaints     Physical Exam   Triage Vital Signs: ED Triage Vitals  Enc Vitals Group     BP 07/17/22 1050 128/67     Pulse Rate 07/17/22 1050 79     Resp 07/17/22 1050 16     Temp 07/17/22 1050 98.1 F (36.7 C)     Temp Source 07/17/22 1050 Oral     SpO2 07/17/22 1050 98 %     Weight 07/17/22 1049 76.7 kg (169 lb 1.5 oz)     Height 07/17/22 1049 1.737 m (5' 8.4")     Head Circumference --      Peak Flow --      Pain Score 07/17/22 1048 0     Pain Loc --      Pain Edu? --      Excl. in GC? --     Most recent vital signs: Vitals:   07/17/22 1050  BP: 128/67  Pulse: 79  Resp: 16  Temp: 98.1 F (36.7 C)  SpO2: 98%     General: Awake, no distress.  CV:  Good peripheral perfusion.  Resp:  Normal effort.  Abd:  No distention.  Other:     ED Results / Procedures / Treatments   Labs (all labs ordered are listed, but only abnormal results are displayed) Labs Reviewed  BASIC METABOLIC PANEL - Abnormal; Notable for the following components:      Result Value   Chloride 112 (*)    CO2 17 (*)    Glucose, Bld 203 (*)    BUN 39 (*)    Creatinine, Ser 1.79 (*)    Calcium 8.7 (*)    GFR, Estimated 39 (*)    All other components within normal limits  CBC - Abnormal; Notable for the following components:   RBC 4.16 (*)    Hemoglobin 12.6 (*)    HCT 38.3 (*)    All other components within normal limits  TROPONIN I (HIGH SENSITIVITY)  TROPONIN I (HIGH SENSITIVITY)     EKG  ED ECG REPORT I, Jene Every, the attending physician, personally  viewed and interpreted this ECG.  Date: 07/17/2022  Rhythm: normal sinus rhythm QRS Axis: normal Intervals: normal ST/T Wave abnormalities: normal Narrative Interpretation: no evidence of acute ischemia    RADIOLOGY     PROCEDURES:  Critical Care performed:   Procedures   MEDICATIONS ORDERED IN ED: Medications - No data to display   IMPRESSION / MDM / ASSESSMENT AND PLAN / ED COURSE  I reviewed the triage vital signs and the nursing notes. Patient's presentation is most consistent with acute complicated illness / injury requiring diagnostic workup.  Patient sent in for elevated potassium, recheck potassium here is normal, potassium is 4.8, BUN/creatinine in line with prior levels, encouraged p.o. hydration, patient is relieved and appropriate for discharge at this time        FINAL CLINICAL IMPRESSION(S) / ED DIAGNOSES   Final diagnoses:  Encounter for medical screening examination  Rx / DC Orders   ED Discharge Orders     None        Note:  This document was prepared using Dragon voice recognition software and may include unintentional dictation errors.   Jene Every, MD 07/17/22 1436

## 2023-01-02 IMAGING — CR DG CHEST 2V
1 series · 2 of 2 positions shown · non-contrast
Comparison: 11/09/2020

CLINICAL DATA: Chest pain

EXAM:
CHEST - 2 VIEW

[Series 1: dg chest 2 view · 0.14mm/px · 2 of 2 slices shown]
[im 1/2]
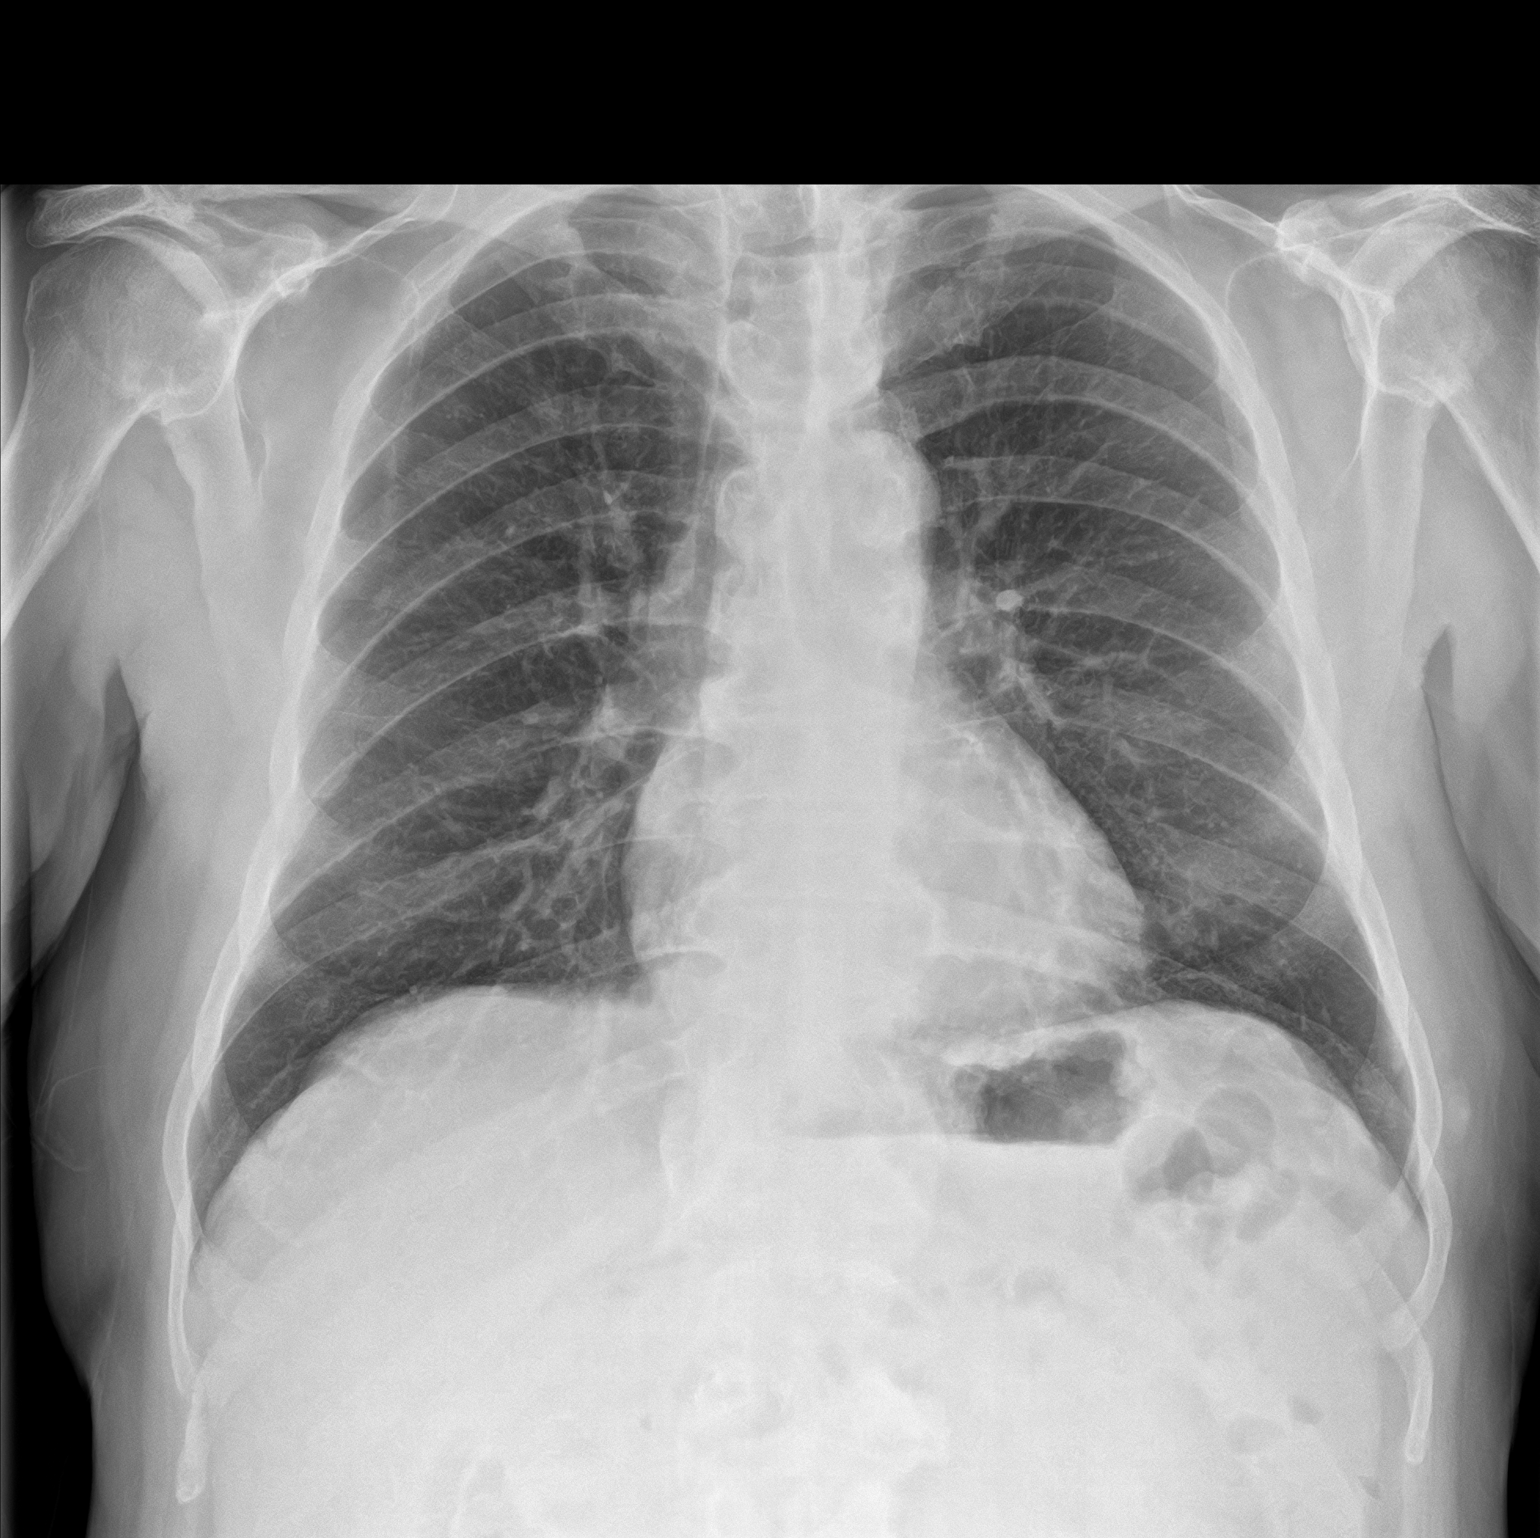
[im 2/2]
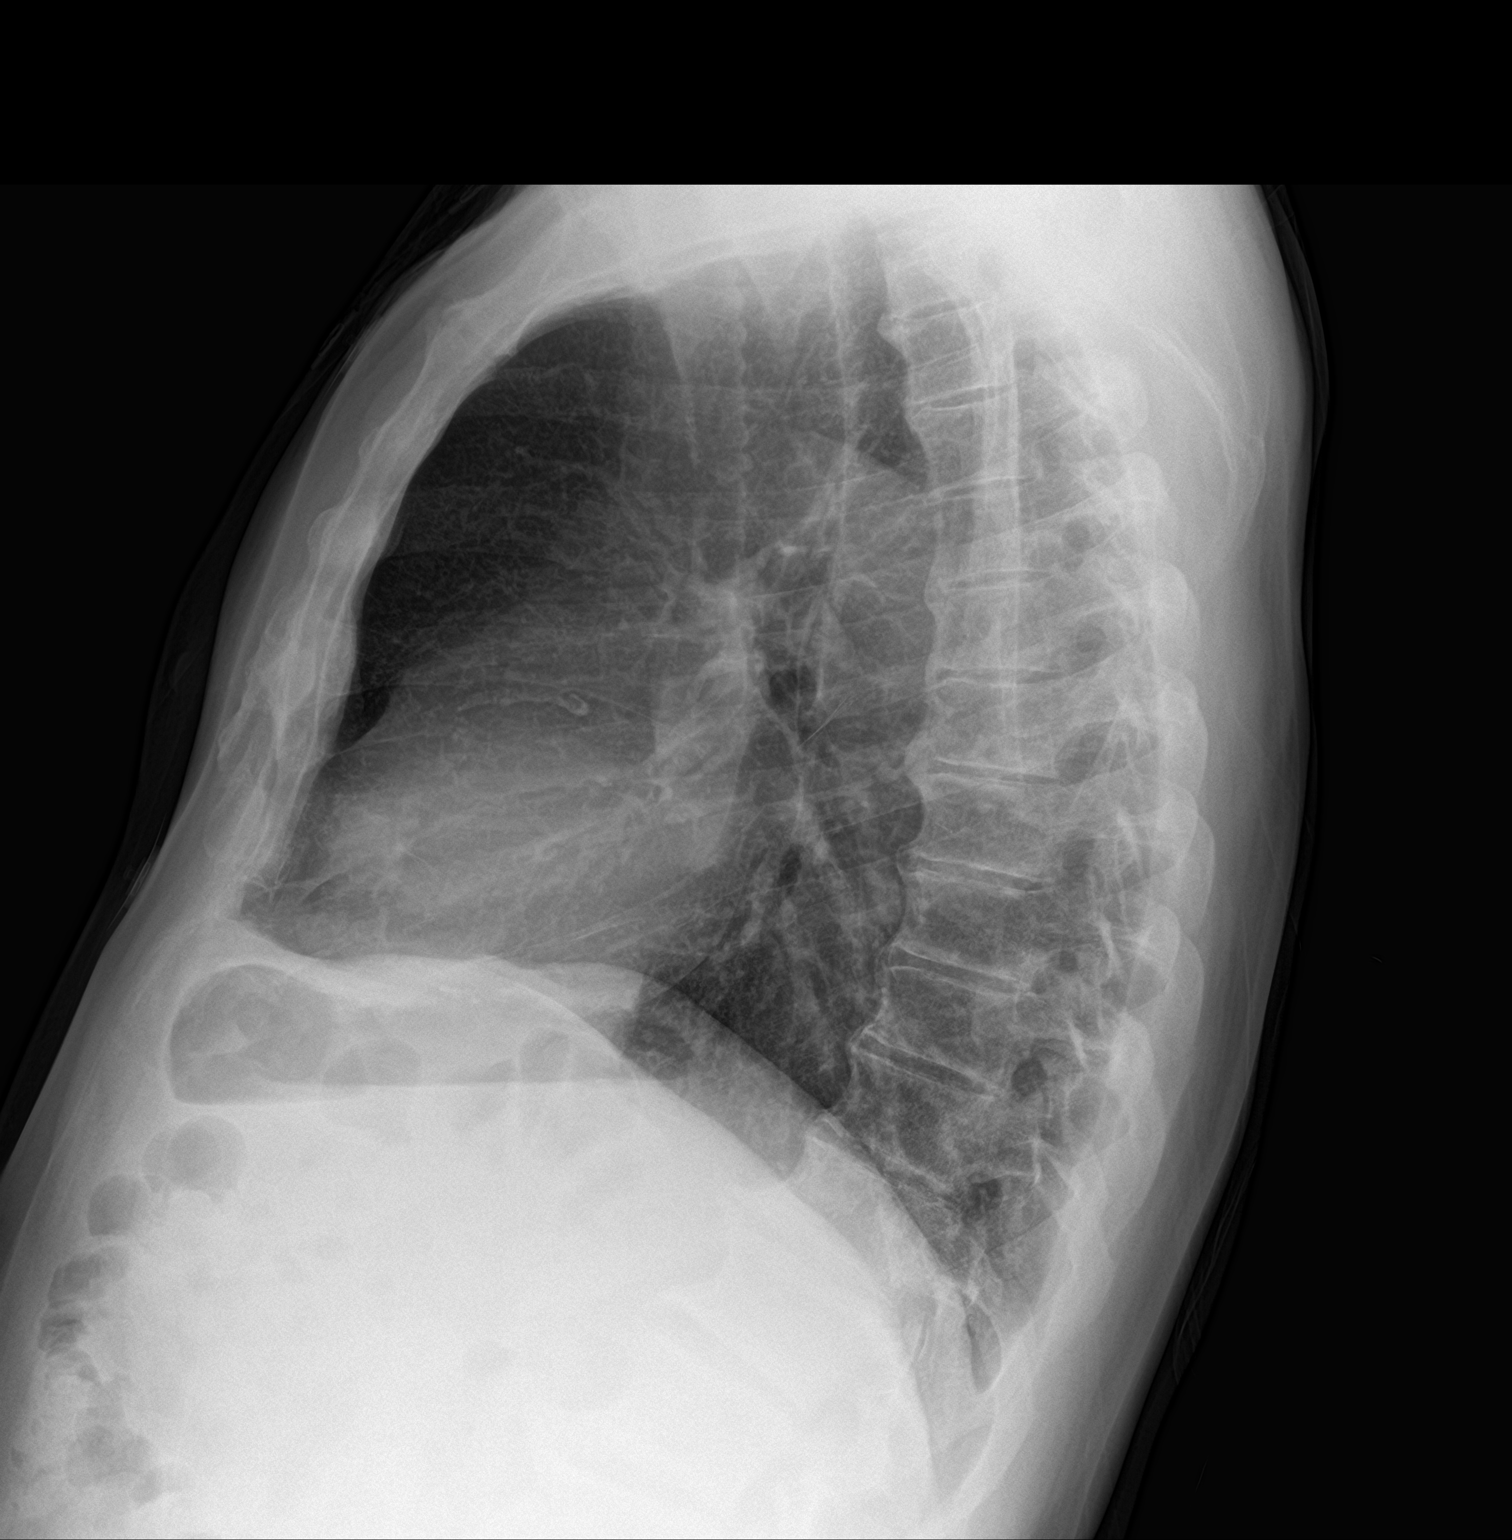

[2 of 2 positions shown; findings below may reference images not displayed]

FINDINGS: The heart size and mediastinal contours are within normal limits.
Both lungs are clear. Degenerative changes of the spine.
IMPRESSION: No active cardiopulmonary disease.
